# Patient Record
Sex: Male | Born: 1995 | Race: Black or African American | Hispanic: No | Marital: Married | Smoking: Never smoker
Health system: Southern US, Community
[De-identification: ages and names within clinical notes are randomized; demographics above are authoritative.]

---
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
## 2019-05-28 ENCOUNTER — Encounter: Payer: Self-pay | Admitting: Emergency Medicine

## 2019-05-28 ENCOUNTER — Ambulatory Visit
Admission: EM | Admit: 2019-05-28 | Discharge: 2019-05-28 | Disposition: A | Discharge status: Home / Self Care | Payer: Managed Care, Other (non HMO) | Attending: Emergency Medicine | Admitting: Emergency Medicine

## 2019-05-28 ENCOUNTER — Other Ambulatory Visit: Payer: Self-pay

## 2019-05-28 DIAGNOSIS — Z20828 Contact with and (suspected) exposure to other viral communicable diseases: Secondary | ICD-10-CM

## 2019-05-28 DIAGNOSIS — R432 Parageusia: Secondary | ICD-10-CM

## 2019-05-28 DIAGNOSIS — Z20822 Contact with and (suspected) exposure to covid-19: Secondary | ICD-10-CM

## 2019-05-28 NOTE — ED Triage Notes (Signed)
Pt presents to [facility] for assessment of 2 days of emesis and diarrhea.  Exposure to [place] on Christmas.  C/o mild loss of taste.

## 2019-05-28 NOTE — ED Notes (Signed)
Patient able to ambulate independently  

## 2019-05-28 NOTE — ED Provider Notes (Signed)
[facility]    CSN: [id number] Arrival date & time: 05/28/19  3151      History   Chief Complaint Chief Complaint  Patient presents with  . Exposure to COVID    HPI Robert Oliver' D Braid is a 23 y.o. male   Presenting for Covid testing: Exposure: Family member Date of exposure: Christmas Any fever, symptoms since exposure: Yes-2-day course of nonbiliary, nonbloody emesis without nausea and associated diarrhea.  No blood, melena, pain with bowel movements.  Less than 5 BMs daily.  Has decreased sense of taste.  No fever, cough, shortness of breath, abdominal pain.   History reviewed. No pertinent past medical history.  There are no problems to display for this patient.   History reviewed. No pertinent surgical history.     Home Medications    Prior to Admission medications   Not on File    Family History Family History  Problem Relation Age of Onset  . Healthy Mother   . Healthy Father     Social History Social History   Tobacco Use  . Smoking status: Never Smoker  . Smokeless tobacco: Never Used  Substance Use Topics  . Alcohol use: Yes    Comment: socially  . Drug use: Never     Allergies   Patient has no known allergies.   Review of Systems Review of Systems  Constitutional: Negative for fatigue and fever.  Respiratory: Negative for cough and shortness of breath.   Cardiovascular: Negative for chest pain and palpitations.  Gastrointestinal: Positive for diarrhea and vomiting. Negative for abdominal distention, abdominal pain, anal bleeding, blood in stool, constipation, nausea and rectal pain.       Resolved  Musculoskeletal: Negative for arthralgias and myalgias.  Skin: Negative for rash and wound.  Neurological: Negative for speech difficulty and headaches.  All other systems reviewed and are negative.    Physical Exam Triage Vital Signs ED Triage Vitals  Enc Vitals Group     BP      Pulse      Resp      Temp       Temp src      SpO2      Weight      Height      Head Circumference      Peak Flow      Pain Score      Pain Loc      Pain Edu?      Excl. in [place]?    No data found.  Updated Vital Signs BP 115/71 (BP Location: Right Arm)   Pulse 94   Temp 97.8 F (36.6 C) (Temporal)   Resp 16   SpO2 98%   Visual Acuity Right Eye Distance:   Left Eye Distance:   Bilateral Distance:    Right Eye Near:   Left Eye Near:    Bilateral Near:     Physical Exam Constitutional:      General: He is not in acute distress.    Appearance: He is not ill-appearing or toxic-appearing.  HENT:     Head: Normocephalic and atraumatic.     Mouth/Throat:     Mouth: Mucous membranes are moist.     Pharynx: Oropharynx is clear.  Eyes:     General: No scleral icterus.    Pupils: Pupils are equal, round, and reactive to light.  Cardiovascular:     Rate and Rhythm: Normal rate.  Pulmonary:     Effort: Pulmonary effort is  normal. No respiratory distress.     Breath sounds: No wheezing.  Abdominal:     General: Bowel sounds are normal.     Tenderness: There is no abdominal tenderness.  Skin:    Coloration: Skin is not jaundiced or pale.  Neurological:     Mental Status: He is alert and oriented to person, place, and time.      UC Treatments / Results  Labs (all labs ordered are listed, but only abnormal results are displayed) Labs Reviewed  NOVEL CORONAVIRUS, NAA    EKG   Radiology No results found.  Procedures Procedures (including critical care time)  Medications Ordered in UC Medications - No data to display  Initial Impression / Assessment and Plan / UC Course  I have reviewed the triage vital signs and the nursing notes.  Pertinent labs & imaging results that were available during my care of the patient were reviewed by me and considered in my medical decision making (see chart for details).     Patient afebrile, nontoxic, with SpO2 98%.  Covid PCR pending.  Patient to  quarantine until results are back.  We will continue supportive management.  Return precautions discussed, patient verbalized understanding and is agreeable to plan. Final Clinical Impressions(s) / UC Diagnoses   Final diagnoses:  Loss of taste  Exposure to COVID-19 virus     Discharge Instructions     Your COVID test is pending - it is important to quarantine / isolate at home until your results are back. If you test positive and would like further evaluation for persistent or worsening symptoms, you may schedule an E-visit or virtual (video) visit throughout the [facility] app or website.  PLEASE NOTE: If you develop severe chest pain or shortness of breath please go to the ER or call [id number] for further evaluation --> DO NOT schedule electronic or virtual visits for this. Please call our office for further guidance / recommendations as needed.    ED Prescriptions    None     PDMP not reviewed this encounter.   Hall-Potvin, [place], [place] 05/29/19 1246

## 2019-05-28 NOTE — Discharge Instructions (Signed)
Your COVID test is pending - it is important to quarantine / isolate at home until your results are back. °If you test positive and would like further evaluation for persistent or worsening symptoms, you may schedule an E-visit or virtual (video) visit throughout the [facility] MyChart app or website. ° °PLEASE NOTE: If you develop severe chest pain or shortness of breath please go to the ER or call 9-1-1 for further evaluation --> DO NOT schedule electronic or virtual visits for this. °Please call our office for further guidance / recommendations as needed. °

## 2019-05-29 LAB — NOVEL CORONAVIRUS, NAA: SARS-CoV-2, NAA: NOT DETECTED

## 2019-08-15 ENCOUNTER — Encounter: Payer: Self-pay | Admitting: Emergency Medicine

## 2019-08-15 ENCOUNTER — Other Ambulatory Visit: Payer: Self-pay

## 2019-08-15 ENCOUNTER — Ambulatory Visit
Admission: EM | Admit: 2019-08-15 | Discharge: 2019-08-15 | Disposition: A | Discharge status: Home / Self Care | Payer: Managed Care, Other (non HMO) | Attending: Emergency Medicine | Admitting: Emergency Medicine

## 2019-08-15 DIAGNOSIS — Z202 Contact with and (suspected) exposure to infections with a predominantly sexual mode of transmission: Secondary | ICD-10-CM | POA: Diagnosis not present

## 2019-08-15 DIAGNOSIS — R300 Dysuria: Secondary | ICD-10-CM | POA: Insufficient documentation

## 2019-08-15 MED ORDER — METRONIDAZOLE 500 MG PO TABS
2000.0000 mg | ORAL_TABLET | Freq: Once | ORAL | Status: AC
  Administered 2019-08-15: 2000 mg via ORAL

## 2019-08-15 NOTE — ED Triage Notes (Signed)
Pt presents to [facility] for assessment after his wife tested positive for Trich this week.  Denies symptoms at this time.

## 2019-08-15 NOTE — ED Notes (Signed)
Patient able to ambulate independently  

## 2019-08-15 NOTE — Discharge Instructions (Signed)
Today you received treatment for trichomonas. Testing for chlamydia, gonorrhea, trichomonas is pending: please look for these results on the MyChart app/website.  We will notify you if you are positive and outline treatment at that time.  Important to avoid all forms of sexual intercourse (oral, vaginal, anal) with any/all partners for the next 7 days to avoid spreading/reinfecting. Any/all sexual partners should be notified of testing/treatment today.  Return for persistent/worsening symptoms or if you develop fever, abdominal or pelvic pain, blood in your urine, or are re-exposed to an STI. 

## 2019-08-15 NOTE — ED Provider Notes (Signed)
[facility]    CSN: [id number] Arrival date & time: 08/15/19  1320      History   Chief Complaint Chief Complaint  Patient presents with  . Exposure to STI    HPI Robert Oliver is a 24 y.o. male presenting for treatment for trichomoniasis.  States his wife tested positive a few days ago: Underwent treatment 2 days ago.  Has not had sexual intercourse since then.  Patient currently sexually active with 1 male partner, not routinely using condoms.  Patient denies penile or testicular pain, swelling, penile discharge.  Patient does admit to mild burning sensation with urination over the last 2 days: Denies frequency, urgency, hematuria.    History reviewed. No pertinent past medical history.  There are no problems to display for this patient.   History reviewed. No pertinent surgical history.     Home Medications    Prior to Admission medications   Not on File    Family History Family History  Problem Relation Age of Onset  . Healthy Mother   . Healthy Father     Social History Social History   Tobacco Use  . Smoking status: Never Smoker  . Smokeless tobacco: Never Used  Substance Use Topics  . Alcohol use: Yes    Comment: socially  . Drug use: Never     Allergies   Patient has no known allergies.   Review of Systems As per HPI   Physical Exam Triage Vital Signs ED Triage Vitals [08/15/19 1328]  Enc Vitals Group     BP 128/82     Pulse Rate 65     Resp 16     Temp 97.8 F (36.6 C)     Temp Source Temporal     SpO2 98 %     Weight      Height      Head Circumference      Peak Flow      Pain Score 0     Pain Loc      Pain Edu?      Excl. in [place]?    No data found.  Updated Vital Signs BP 128/82 (BP Location: Left Arm)   Pulse 65   Temp 97.8 F (36.6 C) (Temporal)   Resp 16   SpO2 98%   Visual Acuity Right Eye Distance:   Left Eye Distance:   Bilateral Distance:    Right Eye Near:   Left Eye Near:     Bilateral Near:     Physical Exam Constitutional:      General: He is not in acute distress. HENT:     Head: Normocephalic and atraumatic.  Eyes:     General: No scleral icterus.    Pupils: Pupils are equal, round, and reactive to light.  Cardiovascular:     Rate and Rhythm: Normal rate.  Pulmonary:     Effort: Pulmonary effort is normal. No respiratory distress.     Breath sounds: No wheezing.  Genitourinary:    Comments: Patient declined: Self swab performed Skin:    Coloration: Skin is not jaundiced or pale.  Neurological:     Mental Status: He is alert and oriented to person, place, and time.      UC Treatments / Results  Labs (all labs ordered are listed, but only abnormal results are displayed) Labs Reviewed  CYTOLOGY, (ORAL, ANAL, URETHRAL) ANCILLARY ONLY    EKG   Radiology No results found.  Procedures Procedures (including critical care  time)  Medications Ordered in UC Medications  metroNIDAZOLE (FLAGYL) tablet 2,000 mg (has no administration in time range)    Initial Impression / Assessment and Plan / UC Course  I have reviewed the triage vital signs and the nursing notes.  Pertinent labs & imaging results that were available during my care of the patient were reviewed by me and considered in my medical decision making (see chart for details).     Patient febrile, nontoxic.  H&P concerning for acute trichomonas infection: Flagyl given in office which patient tolerated well.  Cytology pending.  Treat for G/C if indicated.  Reviewed safe sex practices as outlined below.  Return precautions discussed, patient verbalized understanding and is agreeable to plan. Final Clinical Impressions(s) / UC Diagnoses   Final diagnoses:  Dysuria  Exposure to trichomonas     Discharge Instructions     Today you received treatment for trichomonas. Testing for chlamydia, gonorrhea, trichomonas is pending: please look for these results on the MyChart app/website.   We will notify you if you are positive and outline treatment at that time.  Important to avoid all forms of sexual intercourse (oral, vaginal, anal) with any/all partners for the next 7 days to avoid spreading/reinfecting. Any/all sexual partners should be notified of testing/treatment today.  Return for persistent/worsening symptoms or if you develop fever, abdominal or pelvic pain, blood in your urine, or are re-exposed to an STI.    ED Prescriptions    None     PDMP not reviewed this encounter.   Hall-Potvin, [place], [place] 08/15/19 1345

## 2019-08-17 LAB — CYTOLOGY, (ORAL, ANAL, URETHRAL) ANCILLARY ONLY
Chlamydia: NEGATIVE
Neisseria Gonorrhea: NEGATIVE
Trichomonas: NEGATIVE

## 2020-02-09 ENCOUNTER — Inpatient Hospital Stay (HOSPITAL_COMMUNITY)
Admission: EM | Admit: 2020-02-09 | Discharge: 2020-02-14 | DRG: 958 | Disposition: A | Discharge status: Home / Self Care | Payer: Medicaid Other | Attending: Physician Assistant | Admitting: Physician Assistant

## 2020-02-09 ENCOUNTER — Emergency Department (HOSPITAL_COMMUNITY): Payer: Medicaid Other

## 2020-02-09 DIAGNOSIS — S8251XA Displaced fracture of medial malleolus of right tibia, initial encounter for closed fracture: Secondary | ICD-10-CM | POA: Diagnosis present

## 2020-02-09 DIAGNOSIS — S52572B Other intraarticular fracture of lower end of left radius, initial encounter for open fracture type I or II: Secondary | ICD-10-CM | POA: Diagnosis not present

## 2020-02-09 DIAGNOSIS — N179 Acute kidney failure, unspecified: Secondary | ICD-10-CM | POA: Diagnosis present

## 2020-02-09 DIAGNOSIS — Y92410 Unspecified street and highway as the place of occurrence of the external cause: Secondary | ICD-10-CM

## 2020-02-09 DIAGNOSIS — S52352B Displaced comminuted fracture of shaft of radius, left arm, initial encounter for open fracture type I or II: Secondary | ICD-10-CM | POA: Diagnosis present

## 2020-02-09 DIAGNOSIS — E860 Dehydration: Secondary | ICD-10-CM | POA: Diagnosis present

## 2020-02-09 DIAGNOSIS — N50811 Right testicular pain: Secondary | ICD-10-CM

## 2020-02-09 DIAGNOSIS — E559 Vitamin D deficiency, unspecified: Secondary | ICD-10-CM | POA: Diagnosis present

## 2020-02-09 DIAGNOSIS — S52612B Displaced fracture of left ulna styloid process, initial encounter for open fracture type I or II: Secondary | ICD-10-CM | POA: Diagnosis present

## 2020-02-09 DIAGNOSIS — S0181XA Laceration without foreign body of other part of head, initial encounter: Secondary | ICD-10-CM | POA: Diagnosis present

## 2020-02-09 DIAGNOSIS — S27321A Contusion of lung, unilateral, initial encounter: Secondary | ICD-10-CM | POA: Diagnosis present

## 2020-02-09 DIAGNOSIS — S62631A Displaced fracture of distal phalanx of left index finger, initial encounter for closed fracture: Secondary | ICD-10-CM | POA: Diagnosis present

## 2020-02-09 DIAGNOSIS — Z23 Encounter for immunization: Secondary | ICD-10-CM

## 2020-02-09 DIAGNOSIS — D62 Acute posthemorrhagic anemia: Secondary | ICD-10-CM | POA: Diagnosis not present

## 2020-02-09 DIAGNOSIS — S3022XA Contusion of scrotum and testes, initial encounter: Secondary | ICD-10-CM | POA: Diagnosis present

## 2020-02-09 DIAGNOSIS — T1490XA Injury, unspecified, initial encounter: Secondary | ICD-10-CM | POA: Diagnosis not present

## 2020-02-09 DIAGNOSIS — S52302B Unspecified fracture of shaft of left radius, initial encounter for open fracture type I or II: Secondary | ICD-10-CM | POA: Diagnosis present

## 2020-02-09 DIAGNOSIS — Z419 Encounter for procedure for purposes other than remedying health state, unspecified: Secondary | ICD-10-CM

## 2020-02-09 DIAGNOSIS — F10129 Alcohol abuse with intoxication, unspecified: Secondary | ICD-10-CM | POA: Diagnosis present

## 2020-02-09 DIAGNOSIS — S62305A Unspecified fracture of fourth metacarpal bone, left hand, initial encounter for closed fracture: Secondary | ICD-10-CM | POA: Diagnosis present

## 2020-02-09 DIAGNOSIS — S0502XA Injury of conjunctiva and corneal abrasion without foreign body, left eye, initial encounter: Secondary | ICD-10-CM | POA: Diagnosis present

## 2020-02-09 DIAGNOSIS — S52502A Unspecified fracture of the lower end of left radius, initial encounter for closed fracture: Secondary | ICD-10-CM | POA: Diagnosis present

## 2020-02-09 DIAGNOSIS — Y908 Blood alcohol level of 240 mg/100 ml or more: Secondary | ICD-10-CM | POA: Diagnosis present

## 2020-02-09 DIAGNOSIS — S022XXA Fracture of nasal bones, initial encounter for closed fracture: Secondary | ICD-10-CM | POA: Diagnosis present

## 2020-02-09 DIAGNOSIS — T148XXA Other injury of unspecified body region, initial encounter: Secondary | ICD-10-CM

## 2020-02-09 DIAGNOSIS — M898X9 Other specified disorders of bone, unspecified site: Secondary | ICD-10-CM | POA: Diagnosis present

## 2020-02-09 DIAGNOSIS — Z20822 Contact with and (suspected) exposure to covid-19: Secondary | ICD-10-CM | POA: Diagnosis present

## 2020-02-09 LAB — CBC
HCT: 49 % (ref 39.0–52.0)
Hemoglobin: 16.1 g/dL (ref 13.0–17.0)
MCH: 30.9 pg (ref 26.0–34.0)
MCHC: 32.9 g/dL (ref 30.0–36.0)
MCV: 94 fL (ref 80.0–100.0)
Platelets: 218 10*3/uL (ref 150–400)
RBC: 5.21 MIL/uL (ref 4.22–5.81)
RDW: 11.6 % (ref 11.5–15.5)
WBC: 7.2 10*3/uL (ref 4.0–10.5)
nRBC: 0 % (ref 0.0–0.2)

## 2020-02-09 LAB — COMPREHENSIVE METABOLIC PANEL
ALT: 36 U/L (ref 0–44)
AST: 63 U/L — ABNORMAL HIGH (ref 15–41)
Albumin: 4.2 g/dL (ref 3.5–5.0)
Alkaline Phosphatase: 37 U/L — ABNORMAL LOW (ref 38–126)
Anion gap: 14 (ref 5–15)
BUN: 10 mg/dL (ref 6–20)
CO2: 20 mmol/L — ABNORMAL LOW (ref 22–32)
Calcium: 9.4 mg/dL (ref 8.9–10.3)
Chloride: 105 mmol/L (ref 98–111)
Creatinine, Ser: 1.31 mg/dL — ABNORMAL HIGH (ref 0.61–1.24)
GFR calc Af Amer: >60 mL/min (ref >60)
GFR calc non Af Amer: >60 mL/min (ref >60)
Glucose, Bld: 132 mg/dL — ABNORMAL HIGH (ref 70–99)
Potassium: 4.8 mmol/L (ref 3.5–5.1)
Sodium: 139 mmol/L (ref 135–145)
Total Bilirubin: 1 mg/dL (ref 0.3–1.2)
Total Protein: 7.3 g/dL (ref 6.5–8.1)

## 2020-02-09 LAB — I-STAT CHEM 8, ED
BUN: 11 mg/dL (ref 6–20)
Calcium, Ion: 0.99 mmol/L — ABNORMAL LOW (ref 1.15–1.40)
Chloride: 106 mmol/L (ref 98–111)
Creatinine, Ser: 1.5 mg/dL — ABNORMAL HIGH (ref 0.61–1.24)
Glucose, Bld: 129 mg/dL — ABNORMAL HIGH (ref 70–99)
HCT: 49 % (ref 39.0–52.0)
Hemoglobin: 16.7 g/dL (ref 13.0–17.0)
Potassium: 3.8 mmol/L (ref 3.5–5.1)
Sodium: 142 mmol/L (ref 135–145)
TCO2: 22 mmol/L (ref 22–32)

## 2020-02-09 LAB — LACTIC ACID, PLASMA: Lactic Acid, Venous: 2.5 mmol/L (ref 0.5–1.9)

## 2020-02-09 LAB — ETHANOL: Alcohol, Ethyl (B): 279 mg/dL — ABNORMAL HIGH

## 2020-02-09 LAB — PROTIME-INR
INR: 1.2 (ref 0.8–1.2)
Prothrombin Time: 14.6 seconds (ref 11.4–15.2)

## 2020-02-09 LAB — SARS CORONAVIRUS 2 BY RT PCR (HOSPITAL ORDER, PERFORMED IN ~~LOC~~ HOSPITAL LAB): SARS Coronavirus 2: NEGATIVE

## 2020-02-09 LAB — SAMPLE TO BLOOD BANK

## 2020-02-09 MED ORDER — TETANUS-DIPHTH-ACELL PERTUSSIS 5-2.5-18.5 LF-MCG/0.5 IM SUSP
0.5000 mL | Freq: Once | INTRAMUSCULAR | Status: AC
  Administered 2020-02-09: 0.5 mL via INTRAMUSCULAR
  Filled 2020-02-09: qty 0.5

## 2020-02-09 MED ORDER — LIDOCAINE-EPINEPHRINE (PF) 2 %-1:200000 IJ SOLN
10.0000 mL | Freq: Once | INTRAMUSCULAR | Status: AC
  Administered 2020-02-10: 10 mL via INTRADERMAL
  Filled 2020-02-09: qty 10

## 2020-02-09 MED ORDER — FENTANYL CITRATE (PF) 100 MCG/2ML IJ SOLN
INTRAMUSCULAR | Status: AC | PRN
  Administered 2020-02-09: 100 ug via INTRAVENOUS

## 2020-02-09 MED ORDER — FENTANYL CITRATE (PF) 100 MCG/2ML IJ SOLN
INTRAMUSCULAR | Status: AC
  Filled 2020-02-09: qty 2

## 2020-02-09 MED ORDER — IOHEXOL 300 MG/ML  SOLN
100.0000 mL | Freq: Once | INTRAMUSCULAR | Status: AC | PRN
  Administered 2020-02-09: 100 mL via INTRAVENOUS

## 2020-02-09 MED ORDER — FENTANYL CITRATE (PF) 100 MCG/2ML IJ SOLN
50.0000 ug | Freq: Once | INTRAMUSCULAR | Status: AC
  Administered 2020-02-09: 50 ug via INTRAVENOUS

## 2020-02-09 MED ORDER — TETRACAINE HCL 0.5 % OP SOLN
2.0000 [drp] | Freq: Once | OPHTHALMIC | Status: AC
  Administered 2020-02-10: 2 [drp] via OPHTHALMIC
  Filled 2020-02-09: qty 4

## 2020-02-09 MED ORDER — CEFAZOLIN SODIUM-DEXTROSE 2-4 GM/100ML-% IV SOLN
2.0000 g | Freq: Once | INTRAVENOUS | Status: AC
  Administered 2020-02-09: 2 g via INTRAVENOUS

## 2020-02-09 MED ORDER — FENTANYL CITRATE (PF) 100 MCG/2ML IJ SOLN
INTRAMUSCULAR | Status: AC
Start: 2020-02-09 — End: 2020-02-10
  Filled 2020-02-09: qty 2

## 2020-02-09 MED ORDER — FLUORESCEIN SODIUM 1 MG OP STRP
1.0000 | ORAL_STRIP | Freq: Once | OPHTHALMIC | Status: AC
  Administered 2020-02-10: 1 via OPHTHALMIC
  Filled 2020-02-09: qty 1

## 2020-02-09 NOTE — H&P (Signed)
I have reviewed images of the L forearm. Tentative plan for iv abx, splint. I&d and orif tomorrow.   Formal consult to follow

## 2020-02-09 NOTE — Progress Notes (Signed)
Orthopedic Tech Progress Note Patient Details:  Robert Oliver [birth year] [id number]  Ortho Devices Type of Ortho Device: Sugartong splint Ortho Device/Splint Location: lue. splint applied with drs assist. Ortho Device/Splint Interventions: Ordered, Application, Adjustment   Post Interventions Patient Tolerated: Well Instructions Provided: Care of device, Adjustment of device   Trinna Post 02/09/2020, 11:15 PM
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
## 2020-02-10 ENCOUNTER — Encounter (HOSPITAL_COMMUNITY): Payer: Self-pay

## 2020-02-10 ENCOUNTER — Encounter (HOSPITAL_COMMUNITY): Admission: EM | Disposition: A | Discharge status: Home / Self Care | Payer: Self-pay | Source: Home / Self Care

## 2020-02-10 ENCOUNTER — Inpatient Hospital Stay (HOSPITAL_COMMUNITY): Payer: Medicaid Other

## 2020-02-10 ENCOUNTER — Inpatient Hospital Stay (HOSPITAL_COMMUNITY): Payer: Medicaid Other | Admitting: Certified Registered"

## 2020-02-10 DIAGNOSIS — S27321A Contusion of lung, unilateral, initial encounter: Secondary | ICD-10-CM | POA: Diagnosis present

## 2020-02-10 DIAGNOSIS — S52352B Displaced comminuted fracture of shaft of radius, left arm, initial encounter for open fracture type I or II: Secondary | ICD-10-CM | POA: Diagnosis present

## 2020-02-10 DIAGNOSIS — S62631A Displaced fracture of distal phalanx of left index finger, initial encounter for closed fracture: Secondary | ICD-10-CM | POA: Diagnosis present

## 2020-02-10 DIAGNOSIS — E559 Vitamin D deficiency, unspecified: Secondary | ICD-10-CM | POA: Diagnosis present

## 2020-02-10 DIAGNOSIS — S52572B Other intraarticular fracture of lower end of left radius, initial encounter for open fracture type I or II: Secondary | ICD-10-CM | POA: Diagnosis present

## 2020-02-10 DIAGNOSIS — F10129 Alcohol abuse with intoxication, unspecified: Secondary | ICD-10-CM | POA: Diagnosis present

## 2020-02-10 DIAGNOSIS — E860 Dehydration: Secondary | ICD-10-CM | POA: Diagnosis present

## 2020-02-10 DIAGNOSIS — Z23 Encounter for immunization: Secondary | ICD-10-CM | POA: Diagnosis not present

## 2020-02-10 DIAGNOSIS — D62 Acute posthemorrhagic anemia: Secondary | ICD-10-CM | POA: Diagnosis not present

## 2020-02-10 DIAGNOSIS — S022XXA Fracture of nasal bones, initial encounter for closed fracture: Secondary | ICD-10-CM | POA: Diagnosis present

## 2020-02-10 DIAGNOSIS — Y908 Blood alcohol level of 240 mg/100 ml or more: Secondary | ICD-10-CM | POA: Diagnosis present

## 2020-02-10 DIAGNOSIS — N179 Acute kidney failure, unspecified: Secondary | ICD-10-CM | POA: Diagnosis present

## 2020-02-10 DIAGNOSIS — M898X9 Other specified disorders of bone, unspecified site: Secondary | ICD-10-CM | POA: Diagnosis present

## 2020-02-10 DIAGNOSIS — S3022XA Contusion of scrotum and testes, initial encounter: Secondary | ICD-10-CM | POA: Diagnosis present

## 2020-02-10 DIAGNOSIS — T1490XA Injury, unspecified, initial encounter: Secondary | ICD-10-CM | POA: Diagnosis present

## 2020-02-10 DIAGNOSIS — S52612B Displaced fracture of left ulna styloid process, initial encounter for open fracture type I or II: Secondary | ICD-10-CM | POA: Diagnosis present

## 2020-02-10 DIAGNOSIS — S8251XA Displaced fracture of medial malleolus of right tibia, initial encounter for closed fracture: Secondary | ICD-10-CM | POA: Diagnosis present

## 2020-02-10 DIAGNOSIS — S5290XA Unspecified fracture of unspecified forearm, initial encounter for closed fracture: Secondary | ICD-10-CM

## 2020-02-10 DIAGNOSIS — S52502A Unspecified fracture of the lower end of left radius, initial encounter for closed fracture: Secondary | ICD-10-CM | POA: Diagnosis present

## 2020-02-10 DIAGNOSIS — S62305A Unspecified fracture of fourth metacarpal bone, left hand, initial encounter for closed fracture: Secondary | ICD-10-CM | POA: Diagnosis present

## 2020-02-10 DIAGNOSIS — S0502XA Injury of conjunctiva and corneal abrasion without foreign body, left eye, initial encounter: Secondary | ICD-10-CM | POA: Diagnosis present

## 2020-02-10 DIAGNOSIS — Y92410 Unspecified street and highway as the place of occurrence of the external cause: Secondary | ICD-10-CM | POA: Diagnosis not present

## 2020-02-10 DIAGNOSIS — S0181XA Laceration without foreign body of other part of head, initial encounter: Secondary | ICD-10-CM | POA: Diagnosis present

## 2020-02-10 DIAGNOSIS — Z20822 Contact with and (suspected) exposure to covid-19: Secondary | ICD-10-CM | POA: Diagnosis present

## 2020-02-10 HISTORY — PX: I & D EXTREMITY: SHX5045

## 2020-02-10 HISTORY — PX: PERCUTANEOUS PINNING: SHX2209

## 2020-02-10 HISTORY — PX: OPEN REDUCTION INTERNAL FIXATION (ORIF) DISTAL RADIAL FRACTURE: SHX5989

## 2020-02-10 HISTORY — PX: ORIF DISTAL RADIUS FRACTURE: SUR927

## 2020-02-10 LAB — CBC
HCT: 39.8 % (ref 39.0–52.0)
Hemoglobin: 13.3 g/dL (ref 13.0–17.0)
MCH: 30.3 pg (ref 26.0–34.0)
MCHC: 33.4 g/dL (ref 30.0–36.0)
MCV: 90.7 fL (ref 80.0–100.0)
Platelets: 176 10*3/uL (ref 150–400)
RBC: 4.39 MIL/uL (ref 4.22–5.81)
RDW: 11.4 % — ABNORMAL LOW (ref 11.5–15.5)
WBC: 10.5 10*3/uL (ref 4.0–10.5)
nRBC: 0 % (ref 0.0–0.2)

## 2020-02-10 LAB — URINALYSIS, ROUTINE W REFLEX MICROSCOPIC
Bacteria, UA: NONE SEEN
Bilirubin Urine: NEGATIVE
Glucose, UA: NEGATIVE mg/dL
Ketones, ur: NEGATIVE mg/dL
Leukocytes,Ua: NEGATIVE
Nitrite: NEGATIVE
Protein, ur: 30 mg/dL — AB
Specific Gravity, Urine: 1.016 (ref 1.005–1.030)
pH: 5 (ref 5.0–8.0)

## 2020-02-10 LAB — CREATININE, SERUM
Creatinine, Ser: 1.06 mg/dL (ref 0.61–1.24)
GFR calc Af Amer: >60 mL/min (ref >60)
GFR calc non Af Amer: >60 mL/min (ref >60)

## 2020-02-10 LAB — MAGNESIUM: Magnesium: 1.7 mg/dL (ref 1.7–2.4)

## 2020-02-10 LAB — HIV ANTIBODY (ROUTINE TESTING W REFLEX): HIV Screen 4th Generation wRfx: NONREACTIVE

## 2020-02-10 LAB — PHOSPHORUS: Phosphorus: 3.6 mg/dL (ref 2.5–4.6)

## 2020-02-10 SURGERY — OPEN REDUCTION INTERNAL FIXATION (ORIF) DISTAL RADIUS FRACTURE
Anesthesia: Regional | Laterality: Left

## 2020-02-10 MED ORDER — FENTANYL CITRATE (PF) 100 MCG/2ML IJ SOLN
INTRAMUSCULAR | Status: AC
  Filled 2020-02-10: qty 2

## 2020-02-10 MED ORDER — MIDAZOLAM HCL 2 MG/2ML IJ SOLN
INTRAMUSCULAR | Status: AC
  Filled 2020-02-10: qty 2

## 2020-02-10 MED ORDER — LORAZEPAM 1 MG PO TABS: 1.0000 mg | ORAL_TABLET | ORAL | Status: DC | PRN

## 2020-02-10 MED ORDER — ENOXAPARIN SODIUM 30 MG/0.3ML ~~LOC~~ SOLN
30.0000 mg | Freq: Two times a day (BID) | SUBCUTANEOUS | Status: DC
  Administered 2020-02-10 – 2020-02-14 (×6): 30 mg via SUBCUTANEOUS
  Filled 2020-02-10 (×6): qty 0.3

## 2020-02-10 MED ORDER — DEXAMETHASONE SODIUM PHOSPHATE 10 MG/ML IJ SOLN
INTRAMUSCULAR | Status: DC | PRN
  Administered 2020-02-10: 5 mg via INTRAVENOUS

## 2020-02-10 MED ORDER — SODIUM CHLORIDE 0.9 % IV SOLN
2.0000 g | INTRAVENOUS | Status: AC
  Administered 2020-02-10 – 2020-02-12 (×3): 2 g via INTRAVENOUS
  Filled 2020-02-10: qty 2
  Filled 2020-02-10: qty 20
  Filled 2020-02-10: qty 2
  Filled 2020-02-10: qty 20

## 2020-02-10 MED ORDER — PROPOFOL 10 MG/ML IV BOLUS
INTRAVENOUS | Status: AC
  Filled 2020-02-10: qty 20

## 2020-02-10 MED ORDER — LACTATED RINGERS IV SOLN: INTRAVENOUS | Status: DC

## 2020-02-10 MED ORDER — OXYCODONE HCL 5 MG PO TABS: 5.0000 mg | ORAL_TABLET | Freq: Once | ORAL | Status: DC | PRN

## 2020-02-10 MED ORDER — THIAMINE HCL 100 MG/ML IJ SOLN
100.0000 mg | Freq: Every day | INTRAMUSCULAR | Status: DC
  Administered 2020-02-11: 100 mg via INTRAVENOUS
  Filled 2020-02-10: qty 2

## 2020-02-10 MED ORDER — AMISULPRIDE (ANTIEMETIC) 5 MG/2ML IV SOLN: 10.0000 mg | Freq: Once | INTRAVENOUS | Status: DC | PRN

## 2020-02-10 MED ORDER — CHLORHEXIDINE GLUCONATE 4 % EX LIQD: 60.0000 mL | Freq: Once | CUTANEOUS | Status: DC

## 2020-02-10 MED ORDER — HYDROMORPHONE HCL 1 MG/ML IJ SOLN
INTRAMUSCULAR | Status: AC
  Filled 2020-02-10: qty 0.5

## 2020-02-10 MED ORDER — POVIDONE-IODINE 10 % EX SWAB
2.0000 "application " | Freq: Once | CUTANEOUS | Status: AC
  Administered 2020-02-10: 2 via TOPICAL

## 2020-02-10 MED ORDER — ORAL CARE MOUTH RINSE: 15.0000 mL | Freq: Once | OROMUCOSAL | Status: AC

## 2020-02-10 MED ORDER — SODIUM CHLORIDE 0.9 % IV SOLN: INTRAVENOUS | Status: DC

## 2020-02-10 MED ORDER — ERYTHROMYCIN 5 MG/GM OP OINT
1.0000 "application " | TOPICAL_OINTMENT | Freq: Four times a day (QID) | OPHTHALMIC | Status: DC
  Administered 2020-02-10 – 2020-02-14 (×13): 1 via OPHTHALMIC
  Filled 2020-02-10 (×3): qty 3.5

## 2020-02-10 MED ORDER — ONDANSETRON HCL 4 MG/2ML IJ SOLN
4.0000 mg | Freq: Four times a day (QID) | INTRAMUSCULAR | Status: DC | PRN
  Administered 2020-02-13: 4 mg via INTRAVENOUS

## 2020-02-10 MED ORDER — HYDROMORPHONE HCL 1 MG/ML IJ SOLN
INTRAMUSCULAR | Status: DC | PRN
  Administered 2020-02-10: .5 mg via INTRAVENOUS

## 2020-02-10 MED ORDER — HYDROMORPHONE HCL 1 MG/ML IJ SOLN: 0.2500 mg | INTRAMUSCULAR | Status: DC | PRN

## 2020-02-10 MED ORDER — ACETAMINOPHEN 10 MG/ML IV SOLN: 1000.0000 mg | Freq: Once | INTRAVENOUS | Status: DC | PRN

## 2020-02-10 MED ORDER — CEFAZOLIN SODIUM-DEXTROSE 2-4 GM/100ML-% IV SOLN
2.0000 g | INTRAVENOUS | Status: AC
  Administered 2020-02-10 (×2): 2 g via INTRAVENOUS
  Filled 2020-02-10: qty 100

## 2020-02-10 MED ORDER — OXYCODONE HCL 5 MG/5ML PO SOLN: 5.0000 mg | Freq: Once | ORAL | Status: DC | PRN

## 2020-02-10 MED ORDER — CHLORHEXIDINE GLUCONATE 0.12 % MT SOLN
15.0000 mL | Freq: Once | OROMUCOSAL | Status: DC
  Filled 2020-02-10: qty 15

## 2020-02-10 MED ORDER — ACETAMINOPHEN 10 MG/ML IV SOLN
INTRAVENOUS | Status: AC
  Filled 2020-02-10: qty 100

## 2020-02-10 MED ORDER — ONDANSETRON 4 MG PO TBDP: 4.0000 mg | ORAL_TABLET | Freq: Four times a day (QID) | ORAL | Status: DC | PRN

## 2020-02-10 MED ORDER — SODIUM CHLORIDE 0.9 % IR SOLN
Status: DC | PRN
  Administered 2020-02-10: 3000 mL

## 2020-02-10 MED ORDER — ONDANSETRON HCL 4 MG/2ML IJ SOLN
INTRAMUSCULAR | Status: DC | PRN
  Administered 2020-02-10: 4 mg via INTRAVENOUS

## 2020-02-10 MED ORDER — CHLORHEXIDINE GLUCONATE 0.12 % MT SOLN: 15.0000 mL | Freq: Once | OROMUCOSAL | Status: AC

## 2020-02-10 MED ORDER — PROPOFOL 500 MG/50ML IV EMUL
INTRAVENOUS | Status: DC | PRN
  Administered 2020-02-10: 100 ug/kg/min via INTRAVENOUS

## 2020-02-10 MED ORDER — FENTANYL CITRATE (PF) 250 MCG/5ML IJ SOLN
INTRAMUSCULAR | Status: AC
  Filled 2020-02-10: qty 5

## 2020-02-10 MED ORDER — MEPERIDINE HCL 25 MG/ML IJ SOLN: 6.2500 mg | INTRAMUSCULAR | Status: DC | PRN

## 2020-02-10 MED ORDER — LIDOCAINE HCL (CARDIAC) PF 100 MG/5ML IV SOSY
PREFILLED_SYRINGE | INTRAVENOUS | Status: DC | PRN
  Administered 2020-02-10: 40 mg via INTRAVENOUS

## 2020-02-10 MED ORDER — ACETAMINOPHEN 10 MG/ML IV SOLN
INTRAVENOUS | Status: DC | PRN
  Administered 2020-02-10: 1000 mg via INTRAVENOUS

## 2020-02-10 MED ORDER — DOCUSATE SODIUM 100 MG PO CAPS
100.0000 mg | ORAL_CAPSULE | Freq: Two times a day (BID) | ORAL | Status: DC
  Administered 2020-02-10 – 2020-02-14 (×8): 100 mg via ORAL
  Filled 2020-02-10 (×9): qty 1

## 2020-02-10 MED ORDER — ADULT MULTIVITAMIN W/MINERALS CH
1.0000 | ORAL_TABLET | Freq: Every day | ORAL | Status: DC
  Administered 2020-02-11 – 2020-02-14 (×4): 1 via ORAL
  Filled 2020-02-10 (×4): qty 1

## 2020-02-10 MED ORDER — OXYCODONE HCL 5 MG PO TABS
5.0000 mg | ORAL_TABLET | ORAL | Status: DC | PRN
  Administered 2020-02-10: 5 mg via ORAL
  Administered 2020-02-11 – 2020-02-14 (×10): 10 mg via ORAL
  Filled 2020-02-10 (×7): qty 2
  Filled 2020-02-10: qty 1
  Filled 2020-02-10 (×3): qty 2

## 2020-02-10 MED ORDER — BUPIVACAINE-EPINEPHRINE (PF) 0.5% -1:200000 IJ SOLN
INTRAMUSCULAR | Status: DC | PRN
  Administered 2020-02-10: 30 mL via PERINEURAL

## 2020-02-10 MED ORDER — LORAZEPAM 2 MG/ML IJ SOLN: 1.0000 mg | INTRAMUSCULAR | Status: DC | PRN

## 2020-02-10 MED ORDER — 0.9 % SODIUM CHLORIDE (POUR BTL) OPTIME
TOPICAL | Status: DC | PRN
  Administered 2020-02-10: 1000 mL

## 2020-02-10 MED ORDER — ROCURONIUM BROMIDE 10 MG/ML (PF) SYRINGE
PREFILLED_SYRINGE | INTRAVENOUS | Status: AC
  Filled 2020-02-10: qty 10

## 2020-02-10 MED ORDER — FOLIC ACID 1 MG PO TABS
1.0000 mg | ORAL_TABLET | Freq: Every day | ORAL | Status: DC
  Administered 2020-02-11 – 2020-02-14 (×4): 1 mg via ORAL
  Filled 2020-02-10 (×4): qty 1

## 2020-02-10 MED ORDER — HYDROMORPHONE HCL 1 MG/ML IJ SOLN
1.0000 mg | Freq: Once | INTRAMUSCULAR | Status: AC
  Administered 2020-02-10: 1 mg via INTRAVENOUS
  Filled 2020-02-10: qty 1

## 2020-02-10 MED ORDER — PROPOFOL 10 MG/ML IV BOLUS
INTRAVENOUS | Status: DC | PRN
  Administered 2020-02-10 (×2): 30 mg via INTRAVENOUS

## 2020-02-10 MED ORDER — ACETAMINOPHEN 325 MG PO TABS: 650.0000 mg | ORAL_TABLET | ORAL | Status: DC | PRN

## 2020-02-10 MED ORDER — CEFAZOLIN SODIUM-DEXTROSE 2-4 GM/100ML-% IV SOLN
2.0000 g | Freq: Three times a day (TID) | INTRAVENOUS | Status: DC
  Administered 2020-02-10 – 2020-02-11 (×3): 2 g via INTRAVENOUS
  Filled 2020-02-10 (×3): qty 100

## 2020-02-10 MED ORDER — PROPOFOL 1000 MG/100ML IV EMUL
INTRAVENOUS | Status: AC
  Filled 2020-02-10: qty 300

## 2020-02-10 MED ORDER — MORPHINE SULFATE (PF) 2 MG/ML IV SOLN
2.0000 mg | INTRAVENOUS | Status: DC | PRN
  Administered 2020-02-11: 2 mg via INTRAVENOUS
  Filled 2020-02-10: qty 1

## 2020-02-10 MED ORDER — THIAMINE HCL 100 MG PO TABS
100.0000 mg | ORAL_TABLET | Freq: Every day | ORAL | Status: DC
  Administered 2020-02-12 – 2020-02-14 (×3): 100 mg via ORAL
  Filled 2020-02-10 (×3): qty 1

## 2020-02-10 MED ORDER — OXYCODONE HCL 5 MG PO TABS: 5.0000 mg | ORAL_TABLET | ORAL | Status: DC | PRN

## 2020-02-10 MED ORDER — LIDOCAINE 2% (20 MG/ML) 5 ML SYRINGE
INTRAMUSCULAR | Status: AC
  Filled 2020-02-10: qty 5

## 2020-02-10 MED ORDER — ACETAMINOPHEN 325 MG PO TABS: 325.0000 mg | ORAL_TABLET | Freq: Once | ORAL | Status: DC | PRN

## 2020-02-10 MED ORDER — ACETAMINOPHEN 160 MG/5ML PO SOLN: 325.0000 mg | Freq: Once | ORAL | Status: DC | PRN

## 2020-02-10 MED ORDER — CHLORHEXIDINE GLUCONATE 0.12 % MT SOLN
OROMUCOSAL | Status: AC
  Administered 2020-02-10: 15 mL via OROMUCOSAL
  Filled 2020-02-10: qty 15

## 2020-02-10 SURGICAL SUPPLY — 107 items
BIT DRILL 2.2 SS TIBIAL (BIT) ×2 IMPLANT
BIT DRILL 2.8X5 QR DISP (BIT) ×2 IMPLANT
BLADE 10 SAFETY STRL DISP (BLADE) ×2 IMPLANT
BLADE CLIPPER SURG (BLADE) IMPLANT
BNDG COHESIVE 4X5 TAN STRL (GAUZE/BANDAGES/DRESSINGS) ×2 IMPLANT
BNDG CONFORM 2 STRL LF (GAUZE/BANDAGES/DRESSINGS) ×2 IMPLANT
BNDG ELASTIC 3X5.8 VLCR STR LF (GAUZE/BANDAGES/DRESSINGS) ×2 IMPLANT
BNDG ELASTIC 4X5.8 VLCR NS LF (GAUZE/BANDAGES/DRESSINGS) ×2 IMPLANT
BNDG ELASTIC 4X5.8 VLCR STR LF (GAUZE/BANDAGES/DRESSINGS) ×2 IMPLANT
BNDG ELASTIC 6X5.8 VLCR STR LF (GAUZE/BANDAGES/DRESSINGS) ×2 IMPLANT
BNDG ESMARK 4X9 LF (GAUZE/BANDAGES/DRESSINGS) ×2 IMPLANT
BNDG GAUZE ELAST 4 BULKY (GAUZE/BANDAGES/DRESSINGS) ×2 IMPLANT
BRUSH SCRUB EZ  4% CHG (MISCELLANEOUS) ×1
BRUSH SCRUB EZ 4% CHG (MISCELLANEOUS) ×1 IMPLANT
BRUSH SCRUB EZ PLAIN DRY (MISCELLANEOUS) ×2 IMPLANT
CLEANER TIP ELECTROSURG 2X2 (MISCELLANEOUS) ×2 IMPLANT
CORD BIPOLAR FORCEPS 12FT (ELECTRODE) ×2 IMPLANT
COVER SURGICAL LIGHT HANDLE (MISCELLANEOUS) ×4 IMPLANT
DRAPE C-ARM 42X72 X-RAY (DRAPES) ×2 IMPLANT
DRAPE C-ARMOR (DRAPES) ×2 IMPLANT
DRAPE U-SHAPE 47X51 STRL (DRAPES) ×2 IMPLANT
DRIVER BIT SQUARE 1.7/2.2 (TRAUMA) ×4 IMPLANT
DRSG ADAPTIC 3X8 NADH LF (GAUZE/BANDAGES/DRESSINGS) IMPLANT
DRSG EMULSION OIL 3X3 NADH (GAUZE/BANDAGES/DRESSINGS) IMPLANT
DRSG MEPITEL 4X7.2 (GAUZE/BANDAGES/DRESSINGS) ×2 IMPLANT
DRSG PAD ABDOMINAL 8X10 ST (GAUZE/BANDAGES/DRESSINGS) ×2 IMPLANT
ELECT REM PT RETURN 9FT ADLT (ELECTROSURGICAL) ×2
ELECTRODE REM PT RTRN 9FT ADLT (ELECTROSURGICAL) ×1 IMPLANT
GAUZE SPONGE 4X4 12PLY STRL (GAUZE/BANDAGES/DRESSINGS) ×2 IMPLANT
GLOVE BIO SURGEON STRL SZ7.5 (GLOVE) ×6 IMPLANT
GLOVE BIO SURGEON STRL SZ8 (GLOVE) ×6 IMPLANT
GLOVE BIOGEL PI IND STRL 7.5 (GLOVE) ×1 IMPLANT
GLOVE BIOGEL PI IND STRL 8 (GLOVE) ×1 IMPLANT
GLOVE BIOGEL PI INDICATOR 7.5 (GLOVE) ×1
GLOVE BIOGEL PI INDICATOR 8 (GLOVE) ×1
GOWN STRL REUS W/ TWL LRG LVL3 (GOWN DISPOSABLE) ×2 IMPLANT
GOWN STRL REUS W/ TWL XL LVL3 (GOWN DISPOSABLE) ×2 IMPLANT
GOWN STRL REUS W/TWL LRG LVL3 (GOWN DISPOSABLE) ×2
GOWN STRL REUS W/TWL XL LVL3 (GOWN DISPOSABLE) ×2
HANDPIECE INTERPULSE COAX TIP (DISPOSABLE) ×1
K-WIRE 1.6 (WIRE) ×4
K-WIRE FX5X1.6XNS BN SS (WIRE) ×4
KIT BASIN OR (CUSTOM PROCEDURE TRAY) ×2 IMPLANT
KIT TURNOVER KIT B (KITS) ×2 IMPLANT
KWIRE FX5X1.6XNS BN SS (WIRE) ×4 IMPLANT
MANIFOLD NEPTUNE II (INSTRUMENTS) ×2 IMPLANT
NEEDLE HYPO 25GX1X1/2 BEV (NEEDLE) ×2 IMPLANT
NS IRRIG 1000ML POUR BTL (IV SOLUTION) ×2 IMPLANT
PACK ORTHO EXTREMITY (CUSTOM PROCEDURE TRAY) ×2 IMPLANT
PAD ARMBOARD 7.5X6 YLW CONV (MISCELLANEOUS) ×4 IMPLANT
PAD CAST 3X4 CTTN HI CHSV (CAST SUPPLIES) ×1 IMPLANT
PAD CAST 4YDX4 CTTN HI CHSV (CAST SUPPLIES) ×1 IMPLANT
PADDING CAST ABS 4INX4YD NS (CAST SUPPLIES) ×1
PADDING CAST ABS 6INX4YD NS (CAST SUPPLIES) ×1
PADDING CAST ABS COTTON 4X4 ST (CAST SUPPLIES) ×1 IMPLANT
PADDING CAST ABS COTTON 6X4 NS (CAST SUPPLIES) ×1 IMPLANT
PADDING CAST COTTON 3X4 STRL (CAST SUPPLIES) ×1
PADDING CAST COTTON 4X4 STRL (CAST SUPPLIES) ×1
PADDING CAST COTTON 6X4 STRL (CAST SUPPLIES) ×2 IMPLANT
PEG LOCKING SMOOTH 2.2X16 (Screw) ×2 IMPLANT
PEG LOCKING SMOOTH 2.2X20 (Screw) ×4 IMPLANT
PEG LOCKING SMOOTH 2.2X24 (Peg) ×6 IMPLANT
PEG LOCKING SMOOTH 2.2X26 (Peg) ×2 IMPLANT
PLATE 8 HOLE RADIUS (Plate) ×2 IMPLANT
PLATE STANDARD DVR LEFT (Plate) ×2 IMPLANT
PLATE STD DVR LT 24X51 (Plate) ×1 IMPLANT
SCREW  LP NL 2.7X16MM (Screw) ×1 IMPLANT
SCREW 2.7X14MM (Screw) ×1 IMPLANT
SCREW BN 14X2.7XNONLOCK 3 LD (Screw) ×1 IMPLANT
SCREW HEXALOBE NON-LOCK 3.5X14 (Screw) ×8 IMPLANT
SCREW LOCK 16X2.7X 3 LD TPR (Screw) ×1 IMPLANT
SCREW LOCKING 2.7X15MM (Screw) ×4 IMPLANT
SCREW LOCKING 2.7X16 (Screw) ×1 IMPLANT
SCREW LP NL 2.7X16MM (Screw) ×1 IMPLANT
SCREW MULTI DIRECTIONAL 2.7X20 (Screw) ×2 IMPLANT
SCREW NONLOCK HEX 3.5X12 (Screw) ×4 IMPLANT
SET HNDPC FAN SPRY TIP SCT (DISPOSABLE) ×1 IMPLANT
SLING ARM FOAM STRAP XLG (SOFTGOODS) ×2 IMPLANT
SOAP 2 % CHG 4 OZ (WOUND CARE) ×2 IMPLANT
SOL PREP POV-IOD 4OZ 10% (MISCELLANEOUS) ×2 IMPLANT
SOL PREP PROV IODINE SCRUB 4OZ (MISCELLANEOUS) ×2 IMPLANT
SPLINT PLASTER CAST XFAST 4X15 (CAST SUPPLIES) ×1 IMPLANT
SPLINT PLASTER CAST XFAST 5X30 (CAST SUPPLIES) ×1 IMPLANT
SPLINT PLASTER XFAST SET 5X30 (CAST SUPPLIES) ×1
SPLINT PLASTER XTRA FAST SET 4 (CAST SUPPLIES) ×1
SPONGE LAP 18X18 RF (DISPOSABLE) ×2 IMPLANT
SPONGE LAP 18X18 X RAY DECT (DISPOSABLE) ×6 IMPLANT
STOCKINETTE IMPERVIOUS 9X36 MD (GAUZE/BANDAGES/DRESSINGS) ×2 IMPLANT
SUCTION FRAZIER TIP 8 FR DISP (SUCTIONS) ×2
SUCTION TUBE FRAZIER 8FR DISP (SUCTIONS) ×2 IMPLANT
SUT ETHILON 2 0 PSLX (SUTURE) ×8 IMPLANT
SUT ETHILON 3 0 PS 1 (SUTURE) ×2 IMPLANT
SUT PDS AB 2-0 CT1 27 (SUTURE) ×4 IMPLANT
SUT PROLENE 6 0 P 3 18 (SUTURE) ×2 IMPLANT
SUT VIC AB 0 CT1 27 (SUTURE) ×1
SUT VIC AB 0 CT1 27XBRD ANBCTR (SUTURE) ×1 IMPLANT
SUT VIC AB 0 CT3 27 (SUTURE) IMPLANT
SUT VIC AB 2-0 CT1 27 (SUTURE) ×2
SUT VIC AB 2-0 CT1 TAPERPNT 27 (SUTURE) ×2 IMPLANT
SYR BULB IRRIG 60ML STRL (SYRINGE) ×2 IMPLANT
SYR CONTROL 10ML LL (SYRINGE) IMPLANT
TOWEL GREEN STERILE (TOWEL DISPOSABLE) ×4 IMPLANT
TOWEL GREEN STERILE FF (TOWEL DISPOSABLE) ×2 IMPLANT
TUBE CONNECTING 12X1/4 (SUCTIONS) ×2 IMPLANT
UNDERPAD 30X36 HEAVY ABSORB (UNDERPADS AND DIAPERS) ×2 IMPLANT
WATER STERILE IRR 1000ML POUR (IV SOLUTION) ×2 IMPLANT
YANKAUER SUCT BULB TIP NO VENT (SUCTIONS) ×2 IMPLANT

## 2020-02-10 NOTE — Progress Notes (Signed)
°   02/09/20 2230  Clinical Encounter Type  Visited With Family  Visit Type Trauma  Referral From Nurse  Consult/Referral To Chaplain  Spiritual Encounters  Spiritual Needs Emotional  Stress Factors  Patient Stress Factors Health changes  Family Stress Factors Major life changes  Chaplain responded to page. Patient being seen in [facility]. Waited for patient's spouse and she and patient's mother were taken to Consult A. Chaplain waited with family until spouse able to see patient. Chaplain available when needed.
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
## 2020-02-10 NOTE — Anesthesia Preprocedure Evaluation (Addendum)
Anesthesia Evaluation  Patient identified by MRN, date of birth, ID band Patient awake    Reviewed: Allergy & Precautions, NPO status , Patient's Chart, lab work & pertinent test results, Unable to perform ROS - Chart review only  Airway Mallampati: I  TM Distance: >3 FB Neck ROM: Full    Dental  (+) Dental Advisory Given, Teeth Intact   Pulmonary neg pulmonary ROS,    breath sounds clear to auscultation- rhonchi       Cardiovascular negative cardio ROS   Rhythm:Regular Rate:Normal     Neuro/Psych negative neurological ROS     GI/Hepatic negative GI ROS, Neg liver ROS,   Endo/Other  negative endocrine ROS  Renal/GU negative Renal ROS     Musculoskeletal negative musculoskeletal ROS (+)   Abdominal Normal abdominal exam  (+)   Peds  Hematology negative hematology ROS (+)   Anesthesia Other Findings   Reproductive/Obstetrics                            Anesthesia Physical Anesthesia Plan  ASA: II  Anesthesia Plan: Regional   Post-op Pain Management:    Induction: Intravenous  PONV Risk Score and Plan: 2 and Ondansetron, Propofol infusion and Midazolam  Airway Management Planned: Natural Airway and Simple Face Mask  Additional Equipment: None  Intra-op Plan:   Post-operative Plan:   Informed Consent: I have reviewed the patients History and Physical, chart, labs and discussed the procedure including the risks, benefits and alternatives for the proposed anesthesia with the patient or authorized representative who has indicated his/her understanding and acceptance.     Dental advisory given  Plan Discussed with: CRNA  Anesthesia Plan Comments:        Anesthesia Quick Evaluation

## 2020-02-10 NOTE — ED Notes (Signed)
Urine culture sent with u/a °

## 2020-02-10 NOTE — Transfer of Care (Signed)
Immediate Anesthesia Transfer of Care Note  Patient: Robert Oliver  Procedure(s) Performed: OPEN REDUCTION INTERNAL FIXATION (ORIF) DISTAL RADIAL FRACTURE (Left ) PERCUTANEOUS PINNING 4th METACARPAL (Left ) IRRIGATION AND DEBRIDEMENT FOREARM (Left )  Patient Location: PACU  Anesthesia Type:MAC and Regional  Level of Consciousness: awake, alert , oriented and patient cooperative  Airway & Oxygen Therapy: Patient Spontanous Breathing  Post-op Assessment: Report given to RN and Post -op Vital signs reviewed and stable  Post vital signs: Reviewed and stable  Last Vitals:  Vitals Value Taken Time  BP 132/88 02/10/20 1834  Temp    Pulse 90 02/10/20 1835  Resp 18 02/10/20 1835  SpO2 100 % 02/10/20 1835  Vitals shown include unvalidated device data.  Last Pain:  Vitals:   02/10/20 1232  TempSrc:   PainSc: 10-Worst pain ever      Patients Stated Pain Goal: 2 (78/47/84 1282)  Complications: No complications documented.
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
## 2020-02-10 NOTE — ED Notes (Signed)
COnsent for OR signed by patient

## 2020-02-10 NOTE — Anesthesia Postprocedure Evaluation (Signed)
Anesthesia Post Note  Patient: Robert Oliver  Procedure(s) Performed: OPEN REDUCTION INTERNAL FIXATION (ORIF) DISTAL RADIAL FRACTURE (Left ) PERCUTANEOUS PINNING 4th METACARPAL (Left ) IRRIGATION AND DEBRIDEMENT FOREARM (Left )     Patient location during evaluation: PACU Anesthesia Type: Regional Level of consciousness: sedated, patient cooperative and oriented Pain management: pain level controlled Vital Signs Assessment: post-procedure vital signs reviewed and stable Respiratory status: spontaneous breathing, nonlabored ventilation and respiratory function stable Cardiovascular status: blood pressure returned to baseline and stable Postop Assessment: no apparent nausea or vomiting Anesthetic complications: no   No complications documented.  Last Vitals:  Vitals:   02/10/20 1850 02/10/20 1905  BP: 125/80 120/75  Pulse: 87 75  Resp: 18 13  Temp:    SpO2: 98% 99%    Last Pain:  Vitals:   02/10/20 1905  TempSrc:   PainSc: Asleep                 [place] Katich,E. Hendrik Donath
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
## 2020-02-10 NOTE — Anesthesia Procedure Notes (Signed)
Anesthesia Regional Block: Supraclavicular block   Pre-Anesthetic Checklist: ,, timeout performed, Correct Patient, Correct Site, Correct Laterality, Correct Procedure, Correct Position, site marked, Risks and benefits discussed,  Surgical consent,  Pre-op evaluation,  At surgeon's request and post-op pain management  Laterality: Left  Prep: chloraprep       Needles:  Injection technique: Single-shot  Needle Type: Echogenic Stimulator Needle     Needle Length: 9cm  Needle Gauge: 21     Additional Needles:   Procedures:,,,, ultrasound used (permanent image in chart),,,,  Narrative:  Start time: 02/10/2020 1:25 PM End time: 02/10/2020 1:30 PM Injection made incrementally with aspirations every 5 mL.  Performed by: Personally  Anesthesiologist: Shelton Silvas, MD  Additional Notes: Patient tolerated the procedure well. Local anesthetic introduced in an incremental fashion under minimal resistance after negative aspirations. No paresthesias were elicited. After completion of the procedure, no acute issues were identified and patient continued to be monitored by RN.

## 2020-02-10 NOTE — ED Notes (Signed)
Right ankle swollen = painful -
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
## 2020-02-11 ENCOUNTER — Inpatient Hospital Stay (HOSPITAL_COMMUNITY): Payer: Medicaid Other | Admitting: Certified Registered"

## 2020-02-11 ENCOUNTER — Encounter (HOSPITAL_COMMUNITY): Admission: EM | Disposition: A | Discharge status: Home / Self Care | Payer: Self-pay | Source: Home / Self Care

## 2020-02-11 ENCOUNTER — Inpatient Hospital Stay (HOSPITAL_COMMUNITY): Payer: Medicaid Other

## 2020-02-11 ENCOUNTER — Encounter (HOSPITAL_COMMUNITY): Payer: Self-pay

## 2020-02-11 DIAGNOSIS — N179 Acute kidney failure, unspecified: Secondary | ICD-10-CM | POA: Diagnosis not present

## 2020-02-11 DIAGNOSIS — S27321A Contusion of lung, unilateral, initial encounter: Secondary | ICD-10-CM | POA: Diagnosis not present

## 2020-02-11 DIAGNOSIS — Z20822 Contact with and (suspected) exposure to covid-19: Secondary | ICD-10-CM | POA: Diagnosis not present

## 2020-02-11 DIAGNOSIS — S52572B Other intraarticular fracture of lower end of left radius, initial encounter for open fracture type I or II: Secondary | ICD-10-CM | POA: Diagnosis not present

## 2020-02-11 HISTORY — PX: SCROTAL EXPLORATION: SHX2386

## 2020-02-11 LAB — SURGICAL PCR SCREEN
MRSA, PCR: NEGATIVE
Staphylococcus aureus: POSITIVE — AB

## 2020-02-11 SURGERY — EXPLORATION, SCROTUM
Anesthesia: General | Site: Groin | Laterality: Right

## 2020-02-11 MED ORDER — DEXAMETHASONE SODIUM PHOSPHATE 10 MG/ML IJ SOLN
INTRAMUSCULAR | Status: DC | PRN
  Administered 2020-02-11: 4 mg via INTRAVENOUS

## 2020-02-11 MED ORDER — OXYCODONE HCL 5 MG PO TABS: 5.0000 mg | ORAL_TABLET | Freq: Once | ORAL | Status: DC | PRN

## 2020-02-11 MED ORDER — ONDANSETRON HCL 4 MG/2ML IJ SOLN
INTRAMUSCULAR | Status: DC | PRN
  Administered 2020-02-11: 4 mg via INTRAVENOUS

## 2020-02-11 MED ORDER — FENTANYL CITRATE (PF) 250 MCG/5ML IJ SOLN
INTRAMUSCULAR | Status: AC
  Filled 2020-02-11: qty 5

## 2020-02-11 MED ORDER — PHENYLEPHRINE 40 MCG/ML (10ML) SYRINGE FOR IV PUSH (FOR BLOOD PRESSURE SUPPORT)
PREFILLED_SYRINGE | INTRAVENOUS | Status: DC | PRN
  Administered 2020-02-11 (×4): 80 ug via INTRAVENOUS

## 2020-02-11 MED ORDER — MUPIROCIN 2 % EX OINT
1.0000 "application " | TOPICAL_OINTMENT | Freq: Two times a day (BID) | CUTANEOUS | Status: DC
  Administered 2020-02-11 – 2020-02-14 (×6): 1 via NASAL
  Filled 2020-02-11 (×3): qty 22

## 2020-02-11 MED ORDER — CHLORHEXIDINE GLUCONATE 0.12 % MT SOLN
OROMUCOSAL | Status: AC
  Filled 2020-02-11: qty 15

## 2020-02-11 MED ORDER — PROPOFOL 10 MG/ML IV BOLUS
INTRAVENOUS | Status: AC
  Filled 2020-02-11: qty 20

## 2020-02-11 MED ORDER — LIDOCAINE 2% (20 MG/ML) 5 ML SYRINGE
INTRAMUSCULAR | Status: DC | PRN
  Administered 2020-02-11: 60 mg via INTRAVENOUS

## 2020-02-11 MED ORDER — SUGAMMADEX SODIUM 200 MG/2ML IV SOLN
INTRAVENOUS | Status: DC | PRN
  Administered 2020-02-11: 200 mg via INTRAVENOUS

## 2020-02-11 MED ORDER — BUPIVACAINE HCL (PF) 0.25 % IJ SOLN
INTRAMUSCULAR | Status: AC
  Filled 2020-02-11: qty 20

## 2020-02-11 MED ORDER — CEFAZOLIN SODIUM-DEXTROSE 2-4 GM/100ML-% IV SOLN
INTRAVENOUS | Status: AC
  Filled 2020-02-11: qty 100

## 2020-02-11 MED ORDER — ACETAMINOPHEN 500 MG PO TABS
1000.0000 mg | ORAL_TABLET | Freq: Three times a day (TID) | ORAL | Status: DC
  Administered 2020-02-11 – 2020-02-14 (×9): 1000 mg via ORAL
  Filled 2020-02-11 (×10): qty 2

## 2020-02-11 MED ORDER — ROCURONIUM BROMIDE 10 MG/ML (PF) SYRINGE
PREFILLED_SYRINGE | INTRAVENOUS | Status: DC | PRN
  Administered 2020-02-11: 40 mg via INTRAVENOUS

## 2020-02-11 MED ORDER — HYDROMORPHONE HCL 1 MG/ML IJ SOLN: 0.2500 mg | INTRAMUSCULAR | Status: DC | PRN

## 2020-02-11 MED ORDER — BUPIVACAINE HCL (PF) 0.25 % IJ SOLN
INTRAMUSCULAR | Status: DC | PRN
  Administered 2020-02-11: 10 mL

## 2020-02-11 MED ORDER — MIDAZOLAM HCL 2 MG/2ML IJ SOLN
INTRAMUSCULAR | Status: AC
  Filled 2020-02-11: qty 2

## 2020-02-11 MED ORDER — CHLORHEXIDINE GLUCONATE CLOTH 2 % EX PADS
6.0000 | MEDICATED_PAD | Freq: Every day | CUTANEOUS | Status: DC
  Administered 2020-02-13 – 2020-02-14 (×2): 6 via TOPICAL

## 2020-02-11 MED ORDER — MIDAZOLAM HCL 5 MG/5ML IJ SOLN
INTRAMUSCULAR | Status: DC | PRN
  Administered 2020-02-11: 2 mg via INTRAVENOUS

## 2020-02-11 MED ORDER — BUPIVACAINE HCL (PF) 0.25 % IJ SOLN
INTRAMUSCULAR | Status: AC
  Filled 2020-02-11: qty 30

## 2020-02-11 MED ORDER — FENTANYL CITRATE (PF) 250 MCG/5ML IJ SOLN
INTRAMUSCULAR | Status: DC | PRN
Start: 2020-02-11 — End: 2020-02-11
  Administered 2020-02-11 (×2): 50 ug via INTRAVENOUS

## 2020-02-11 MED ORDER — CEFAZOLIN SODIUM-DEXTROSE 2-3 GM-%(50ML) IV SOLR
INTRAVENOUS | Status: DC | PRN
  Administered 2020-02-11: 2 g via INTRAVENOUS

## 2020-02-11 MED ORDER — PROPOFOL 10 MG/ML IV BOLUS
INTRAVENOUS | Status: DC | PRN
  Administered 2020-02-11: 150 mg via INTRAVENOUS

## 2020-02-11 MED ORDER — SUCCINYLCHOLINE CHLORIDE 200 MG/10ML IV SOSY
PREFILLED_SYRINGE | INTRAVENOUS | Status: DC | PRN
  Administered 2020-02-11: 120 mg via INTRAVENOUS

## 2020-02-11 MED ORDER — ONDANSETRON HCL 4 MG/2ML IJ SOLN: 4.0000 mg | Freq: Once | INTRAMUSCULAR | Status: DC | PRN

## 2020-02-11 MED ORDER — ACETAMINOPHEN 325 MG PO TABS: 325.0000 mg | ORAL_TABLET | ORAL | Status: DC | PRN

## 2020-02-11 MED ORDER — OXYCODONE HCL 5 MG/5ML PO SOLN: 5.0000 mg | Freq: Once | ORAL | Status: DC | PRN

## 2020-02-11 MED ORDER — 0.9 % SODIUM CHLORIDE (POUR BTL) OPTIME
TOPICAL | Status: DC | PRN
  Administered 2020-02-11: 1000 mL

## 2020-02-11 MED ORDER — ACETAMINOPHEN 160 MG/5ML PO SOLN: 325.0000 mg | ORAL | Status: DC | PRN

## 2020-02-11 MED ORDER — METHOCARBAMOL 500 MG PO TABS
1000.0000 mg | ORAL_TABLET | Freq: Three times a day (TID) | ORAL | Status: DC
  Administered 2020-02-11 – 2020-02-14 (×8): 1000 mg via ORAL
  Filled 2020-02-11 (×8): qty 2

## 2020-02-11 SURGICAL SUPPLY — 28 items
BLADE CLIPPER SURG (BLADE) ×3 IMPLANT
BLADE HEX COATED 2.75 (ELECTRODE) ×3 IMPLANT
BNDG GAUZE ELAST 4 BULKY (GAUZE/BANDAGES/DRESSINGS) IMPLANT
COVER SURGICAL LIGHT HANDLE (MISCELLANEOUS) ×3 IMPLANT
COVER WAND RF STERILE (DRAPES) IMPLANT
DERMABOND ADVANCED (GAUZE/BANDAGES/DRESSINGS) ×2
DERMABOND ADVANCED .7 DNX12 (GAUZE/BANDAGES/DRESSINGS) ×1 IMPLANT
DRAIN PENROSE 0.25X18 (DRAIN) ×3 IMPLANT
DRAPE LAPAROTOMY 100X72 PEDS (DRAPES) ×3 IMPLANT
ELECT REM PT RETURN 15FT ADLT (MISCELLANEOUS) ×3 IMPLANT
GLOVE BIO SURGEON STRL SZ7.5 (GLOVE) ×6 IMPLANT
GOWN STRL REUS W/ TWL LRG LVL3 (GOWN DISPOSABLE) ×1 IMPLANT
GOWN STRL REUS W/TWL LRG LVL3 (GOWN DISPOSABLE) ×2
KIT BASIN OR (CUSTOM PROCEDURE TRAY) ×3 IMPLANT
NEEDLE HYPO 22GX1.5 SAFETY (NEEDLE) IMPLANT
NS IRRIG 1000ML POUR BTL (IV SOLUTION) ×3 IMPLANT
PACK GENERAL/GYN (CUSTOM PROCEDURE TRAY) ×3 IMPLANT
SOL PREP POV-IOD 4OZ 10% (MISCELLANEOUS) ×3 IMPLANT
SUPPORT SCROTAL LG STRP (MISCELLANEOUS) ×2 IMPLANT
SUPPORTER ATHLETIC LG (MISCELLANEOUS) ×1
SUT CHROMIC 3 0 SH 27 (SUTURE) ×6 IMPLANT
SUT PDS AB 4-0 SH 27 (SUTURE) ×3 IMPLANT
SUT PROLENE 4 0 SH DA (SUTURE) ×3 IMPLANT
SUT VIC AB 2-0 UR5 27 (SUTURE) IMPLANT
SUT VICRYL 0 TIES 12 18 (SUTURE) IMPLANT
SYR CONTROL 10ML LL (SYRINGE) ×3 IMPLANT
TOWEL GREEN STERILE (TOWEL DISPOSABLE) ×6 IMPLANT
WATER STERILE IRR 1000ML POUR (IV SOLUTION) ×3 IMPLANT
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
## 2020-02-11 NOTE — Progress Notes (Signed)
PT Cancellation Note  Patient Details Name: Robert Oliver MRN: [id number] DOB: [birth year]   Cancelled Treatment:    Reason Eval/Treat Not Completed: Medical issues which prohibited therapy (Patient to undergo a procedure 9/14. PA-C requesting to hold. Will follow.)  Tamala Ser PT 02/11/2020  Acute Rehabilitation Services Pager [phone] Office [phone]

## 2020-02-11 NOTE — TOC CAGE-AID Note (Signed)
Transition of Care [facility]) - CAGE-AID Screening   Patient Details  Name: Robert Oliver MRN: [id number] Date of Birth: 03/27/1996  Transition of Care [facility] CM/SW Contact:    Emeterio Reeve, [place] Phone Number: 02/11/2020, 2:18 PM   Clinical Narrative:  CSW met with pt at bedside. CSW introduced self and explained her role at the hospital.  Pt reports alcohol use of a couple times a month. Pt denies alcohol use. Pt declined resources and Scientist, clinical (histocompatibility and immunogenetics).  CAGE-AID Screening:    Have You Ever Felt You Ought to Cut Down on Your Drinking or Drug Use?: Yes Have People Annoyed You By Critizing Your Drinking Or Drug Use?: No Have You Felt Bad Or Guilty About Your Drinking Or Drug Use?: No Have You Ever Had a Drink or Used Drugs First Thing In The Morning to Steady Your Nerves or to Get Rid of a Hangover?: No CAGE-AID Score: 1  Substance Abuse Education Offered: Yes  Substance abuse interventions: Patient Counseling   Emeterio Reeve, Latanya Presser, [place] Social Worker [phone]

## 2020-02-11 NOTE — Progress Notes (Signed)
11:47 AM I received a call from radiology with findings of fracture to patients right testicle with devitalized inferior pole and adjacent small hematoma. I have made the patient NPO and paged Urology. Will update the patient on findings.   Leary Roca, [facility].
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
## 2020-02-11 NOTE — Progress Notes (Signed)
OT Cancellation Note  Patient Details Name: Robert Oliver MRN: [id number] DOB: 1995-08-29   Cancelled Treatment:    Reason Eval/Treat Not Completed: Patient not medically ready (Patient to undergo a procedure 9/14. PA-C requesting to hold)  OT to continue to follow in the acute setting.    [name] D Riti Rollyson 02/11/2020, 11:55 AM  02/11/2020  Rich, OTR/L  Acute Rehabilitation Services  Office:  [phone]
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
## 2020-02-11 NOTE — Anesthesia Postprocedure Evaluation (Signed)
Anesthesia Post Note  Patient: Robert Oliver  Procedure(s) Performed: SCROTUM EXPLORATION WITH RIGHT TESTICULAR REPAIR (Right Groin)     Patient location during evaluation: PACU Anesthesia Type: General Level of consciousness: awake and alert Pain management: pain level controlled Vital Signs Assessment: post-procedure vital signs reviewed and stable Respiratory status: spontaneous breathing, nonlabored ventilation, respiratory function stable and patient connected to nasal cannula oxygen Cardiovascular status: blood pressure returned to baseline and stable Postop Assessment: no apparent nausea or vomiting Anesthetic complications: no   No complications documented.  Last Vitals:  Vitals:   02/11/20 1800 02/11/20 1904  BP: (!) 145/93 (!) 138/102  Pulse: 94 84  Resp: (!) 9 16  Temp:  37.2 C  SpO2: 97% 99%    Last Pain:  Vitals:   02/11/20 1904  TempSrc: Oral  PainSc:                  Nelle Don Juniel Groene

## 2020-02-11 NOTE — Transfer of Care (Signed)
Immediate Anesthesia Transfer of Care Note  Patient: Robert Oliver  Procedure(s) Performed: SCROTUM EXPLORATION WITH RIGHT TESTICULAR REPAIR (Right Groin)  Patient Location: PACU  Anesthesia Type:General  Level of Consciousness: awake, alert , oriented and patient cooperative  Airway & Oxygen Therapy: Patient Spontanous Breathing and Patient connected to face mask oxygen  Post-op Assessment: Report given to RN and Post -op Vital signs reviewed and stable  Post vital signs: Reviewed and stable  Last Vitals:  Vitals Value Taken Time  BP 129/81 02/11/20 1728  Temp    Pulse 101 02/11/20 1730  Resp 13 02/11/20 1730  SpO2 100 % 02/11/20 1730  Vitals shown include unvalidated device data.  Last Pain:  Vitals:   02/11/20 1452  TempSrc: Oral  PainSc:       Patients Stated Pain Goal: 2 (02/10/20 1232)  Complications: No complications documented.

## 2020-02-11 NOTE — Anesthesia Procedure Notes (Signed)
Procedure Name: Intubation Date/Time: 02/11/2020 4:18 PM Performed by: Wilburn Cornelia, CRNA Pre-anesthesia Checklist: Patient identified, Emergency Drugs available, Suction available, Patient being monitored and Timeout performed Patient Re-evaluated:Patient Re-evaluated prior to induction Oxygen Delivery Method: Circle system utilized Preoxygenation: Pre-oxygenation with 100% oxygen Induction Type: IV induction and Rapid sequence Laryngoscope Size: Mac and 4 Grade View: Grade I Tube type: Oral Tube size: 7.5 mm Number of attempts: 1 Airway Equipment and Method: Stylet Placement Confirmation: ETT inserted through vocal cords under direct vision,  positive ETCO2,  CO2 detector and breath sounds checked- equal and bilateral Secured at: 23 cm Tube secured with: Tape Dental Injury: Teeth and Oropharynx as per pre-operative assessment
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
## 2020-02-12 ENCOUNTER — Encounter (HOSPITAL_COMMUNITY): Payer: Self-pay | Admitting: Urology

## 2020-02-12 DIAGNOSIS — E559 Vitamin D deficiency, unspecified: Secondary | ICD-10-CM | POA: Diagnosis present

## 2020-02-12 DIAGNOSIS — S8251XA Displaced fracture of medial malleolus of right tibia, initial encounter for closed fracture: Secondary | ICD-10-CM

## 2020-02-12 DIAGNOSIS — S52302B Unspecified fracture of shaft of left radius, initial encounter for open fracture type I or II: Secondary | ICD-10-CM

## 2020-02-12 HISTORY — DX: Unspecified fracture of shaft of left radius, initial encounter for open fracture type I or II: S52.302B

## 2020-02-12 HISTORY — DX: Displaced fracture of medial malleolus of right tibia, initial encounter for closed fracture: S82.51XA

## 2020-02-12 HISTORY — DX: Vitamin D deficiency, unspecified: E55.9

## 2020-02-12 LAB — CBC
HCT: 33.8 % — ABNORMAL LOW (ref 39.0–52.0)
Hemoglobin: 11.5 g/dL — ABNORMAL LOW (ref 13.0–17.0)
MCH: 31 pg (ref 26.0–34.0)
MCHC: 34 g/dL (ref 30.0–36.0)
MCV: 91.1 fL (ref 80.0–100.0)
Platelets: 156 10*3/uL (ref 150–400)
RBC: 3.71 MIL/uL — ABNORMAL LOW (ref 4.22–5.81)
RDW: 11.3 % — ABNORMAL LOW (ref 11.5–15.5)
WBC: 13.7 10*3/uL — ABNORMAL HIGH (ref 4.0–10.5)
nRBC: 0 % (ref 0.0–0.2)

## 2020-02-12 LAB — VITAMIN D 25 HYDROXY (VIT D DEFICIENCY, FRACTURES): Vit D, 25-Hydroxy: 6.82 ng/mL — ABNORMAL LOW (ref 30–100)

## 2020-02-12 MED ORDER — VITAMIN D 25 MCG (1000 UNIT) PO TABS
2000.0000 [IU] | ORAL_TABLET | Freq: Two times a day (BID) | ORAL | Status: DC
  Administered 2020-02-12 – 2020-02-14 (×4): 2000 [IU] via ORAL
  Filled 2020-02-12 (×4): qty 2

## 2020-02-12 MED ORDER — CEFAZOLIN SODIUM-DEXTROSE 2-4 GM/100ML-% IV SOLN
2.0000 g | INTRAVENOUS | Status: AC
  Administered 2020-02-13: 2 g via INTRAVENOUS
  Filled 2020-02-12 (×2): qty 100

## 2020-02-12 MED ORDER — VITAMIN D (ERGOCALCIFEROL) 1.25 MG (50000 UNIT) PO CAPS
50000.0000 [IU] | ORAL_CAPSULE | ORAL | Status: DC
  Filled 2020-02-12: qty 1

## 2020-02-12 MED ORDER — POLYETHYLENE GLYCOL 3350 17 G PO PACK
17.0000 g | PACK | Freq: Every day | ORAL | Status: DC
  Administered 2020-02-12 – 2020-02-14 (×3): 17 g via ORAL
  Filled 2020-02-12 (×3): qty 1

## 2020-02-12 MED ORDER — ASCORBIC ACID 500 MG PO TABS
1000.0000 mg | ORAL_TABLET | Freq: Every day | ORAL | Status: DC
  Administered 2020-02-13 – 2020-02-14 (×2): 1000 mg via ORAL
  Filled 2020-02-12 (×2): qty 2
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
## 2020-02-13 ENCOUNTER — Inpatient Hospital Stay (HOSPITAL_COMMUNITY): Payer: Medicaid Other

## 2020-02-13 ENCOUNTER — Encounter (HOSPITAL_COMMUNITY): Admission: EM | Disposition: A | Discharge status: Home / Self Care | Payer: Self-pay | Source: Home / Self Care

## 2020-02-13 ENCOUNTER — Inpatient Hospital Stay (HOSPITAL_COMMUNITY): Payer: Medicaid Other | Admitting: Anesthesiology

## 2020-02-13 ENCOUNTER — Encounter (HOSPITAL_COMMUNITY): Payer: Self-pay

## 2020-02-13 DIAGNOSIS — S52572B Other intraarticular fracture of lower end of left radius, initial encounter for open fracture type I or II: Secondary | ICD-10-CM | POA: Diagnosis not present

## 2020-02-13 DIAGNOSIS — N179 Acute kidney failure, unspecified: Secondary | ICD-10-CM | POA: Diagnosis not present

## 2020-02-13 DIAGNOSIS — S27321A Contusion of lung, unilateral, initial encounter: Secondary | ICD-10-CM | POA: Diagnosis not present

## 2020-02-13 DIAGNOSIS — Z20822 Contact with and (suspected) exposure to covid-19: Secondary | ICD-10-CM | POA: Diagnosis not present

## 2020-02-13 HISTORY — PX: ORIF ANKLE FRACTURE: SHX5408

## 2020-02-13 LAB — BASIC METABOLIC PANEL
Anion gap: 8 (ref 5–15)
BUN: 5 mg/dL — ABNORMAL LOW (ref 6–20)
CO2: 28 mmol/L (ref 22–32)
Calcium: 9 mg/dL (ref 8.9–10.3)
Chloride: 100 mmol/L (ref 98–111)
Creatinine, Ser: 0.85 mg/dL (ref 0.61–1.24)
GFR calc Af Amer: >60 mL/min (ref >60)
GFR calc non Af Amer: >60 mL/min (ref >60)
Glucose, Bld: 97 mg/dL (ref 70–99)
Potassium: 3.8 mmol/L (ref 3.5–5.1)
Sodium: 136 mmol/L (ref 135–145)

## 2020-02-13 LAB — CBC
HCT: 30.2 % — ABNORMAL LOW (ref 39.0–52.0)
Hemoglobin: 10.2 g/dL — ABNORMAL LOW (ref 13.0–17.0)
MCH: 30.3 pg (ref 26.0–34.0)
MCHC: 33.8 g/dL (ref 30.0–36.0)
MCV: 89.6 fL (ref 80.0–100.0)
Platelets: 159 10*3/uL (ref 150–400)
RBC: 3.37 MIL/uL — ABNORMAL LOW (ref 4.22–5.81)
RDW: 11.2 % — ABNORMAL LOW (ref 11.5–15.5)
WBC: 8.4 10*3/uL (ref 4.0–10.5)
nRBC: 0 % (ref 0.0–0.2)

## 2020-02-13 SURGERY — OPEN REDUCTION INTERNAL FIXATION (ORIF) ANKLE FRACTURE
Anesthesia: Choice | Site: Ankle | Laterality: Right

## 2020-02-13 MED ORDER — DEXAMETHASONE SODIUM PHOSPHATE 10 MG/ML IJ SOLN
INTRAMUSCULAR | Status: AC
  Filled 2020-02-13: qty 1

## 2020-02-13 MED ORDER — FENTANYL CITRATE (PF) 100 MCG/2ML IJ SOLN: 25.0000 ug | INTRAMUSCULAR | Status: DC | PRN

## 2020-02-13 MED ORDER — FENTANYL CITRATE (PF) 250 MCG/5ML IJ SOLN
INTRAMUSCULAR | Status: AC
  Filled 2020-02-13: qty 5

## 2020-02-13 MED ORDER — CHLORHEXIDINE GLUCONATE 0.12 % MT SOLN: 15.0000 mL | Freq: Once | OROMUCOSAL | Status: AC

## 2020-02-13 MED ORDER — ROCURONIUM BROMIDE 10 MG/ML (PF) SYRINGE
PREFILLED_SYRINGE | INTRAVENOUS | Status: AC
  Filled 2020-02-13: qty 10

## 2020-02-13 MED ORDER — DEXAMETHASONE SODIUM PHOSPHATE 10 MG/ML IJ SOLN
INTRAMUSCULAR | Status: DC | PRN
  Administered 2020-02-13: 10 mg via INTRAVENOUS

## 2020-02-13 MED ORDER — ORAL CARE MOUTH RINSE: 15.0000 mL | Freq: Once | OROMUCOSAL | Status: AC

## 2020-02-13 MED ORDER — ONDANSETRON HCL 4 MG/2ML IJ SOLN
INTRAMUSCULAR | Status: AC
  Filled 2020-02-13: qty 2

## 2020-02-13 MED ORDER — ROCURONIUM BROMIDE 10 MG/ML (PF) SYRINGE
PREFILLED_SYRINGE | INTRAVENOUS | Status: DC | PRN
  Administered 2020-02-13: 60 mg via INTRAVENOUS

## 2020-02-13 MED ORDER — 0.9 % SODIUM CHLORIDE (POUR BTL) OPTIME
TOPICAL | Status: DC | PRN
  Administered 2020-02-13: 1000 mL

## 2020-02-13 MED ORDER — PROPOFOL 10 MG/ML IV BOLUS
INTRAVENOUS | Status: AC
  Filled 2020-02-13: qty 20

## 2020-02-13 MED ORDER — LIDOCAINE 2% (20 MG/ML) 5 ML SYRINGE
INTRAMUSCULAR | Status: DC | PRN
  Administered 2020-02-13: 40 mg via INTRAVENOUS

## 2020-02-13 MED ORDER — FENTANYL CITRATE (PF) 250 MCG/5ML IJ SOLN
INTRAMUSCULAR | Status: DC | PRN
Start: 2020-02-13 — End: 2020-02-13
  Administered 2020-02-13 (×2): 100 ug via INTRAVENOUS
  Administered 2020-02-13: 50 ug via INTRAVENOUS

## 2020-02-13 MED ORDER — ONDANSETRON HCL 4 MG/2ML IJ SOLN: 4.0000 mg | Freq: Once | INTRAMUSCULAR | Status: DC | PRN

## 2020-02-13 MED ORDER — CHLORHEXIDINE GLUCONATE 0.12 % MT SOLN
OROMUCOSAL | Status: AC
  Administered 2020-02-13: 15 mL via OROMUCOSAL
  Filled 2020-02-13: qty 15

## 2020-02-13 MED ORDER — MIDAZOLAM HCL 2 MG/2ML IJ SOLN
INTRAMUSCULAR | Status: AC
  Filled 2020-02-13: qty 2

## 2020-02-13 MED ORDER — MIDAZOLAM HCL 5 MG/5ML IJ SOLN
INTRAMUSCULAR | Status: DC | PRN
  Administered 2020-02-13: 2 mg via INTRAVENOUS

## 2020-02-13 MED ORDER — SUGAMMADEX SODIUM 200 MG/2ML IV SOLN
INTRAVENOUS | Status: DC | PRN
  Administered 2020-02-13: 200 mg via INTRAVENOUS

## 2020-02-13 MED ORDER — LACTATED RINGERS IV SOLN: INTRAVENOUS | Status: DC

## 2020-02-13 MED ORDER — FENTANYL CITRATE (PF) 100 MCG/2ML IJ SOLN
INTRAMUSCULAR | Status: DC
Start: 2020-02-13 — End: 2020-02-13
  Filled 2020-02-13: qty 2

## 2020-02-13 MED ORDER — PROPOFOL 10 MG/ML IV BOLUS
INTRAVENOUS | Status: DC | PRN
  Administered 2020-02-13: 170 mg via INTRAVENOUS

## 2020-02-13 MED ORDER — LIDOCAINE 2% (20 MG/ML) 5 ML SYRINGE
INTRAMUSCULAR | Status: AC
  Filled 2020-02-13: qty 5

## 2020-02-13 MED ORDER — CEFAZOLIN SODIUM-DEXTROSE 2-4 GM/100ML-% IV SOLN
2.0000 g | Freq: Three times a day (TID) | INTRAVENOUS | Status: AC
  Administered 2020-02-13 – 2020-02-14 (×3): 2 g via INTRAVENOUS
  Filled 2020-02-13 (×3): qty 100

## 2020-02-13 SURGICAL SUPPLY — 60 items
BANDAGE ESMARK 6X9 LF (GAUZE/BANDAGES/DRESSINGS) ×1 IMPLANT
BIT DRILL CANNULTD 2.6 X 130MM (DRILL) ×1 IMPLANT
BIT DRILL SOLID 2.0 X 110MM (DRILL) ×1 IMPLANT
BNDG ELASTIC 4X5.8 VLCR STR LF (GAUZE/BANDAGES/DRESSINGS) ×2 IMPLANT
BNDG ELASTIC 6X5.8 VLCR STR LF (GAUZE/BANDAGES/DRESSINGS) ×2 IMPLANT
BNDG ESMARK 6X9 LF (GAUZE/BANDAGES/DRESSINGS) ×2
BNDG GAUZE ELAST 4 BULKY (GAUZE/BANDAGES/DRESSINGS) ×8 IMPLANT
BNDG STRETCH 4X75 NS LF (GAUZE/BANDAGES/DRESSINGS) ×2 IMPLANT
BRUSH SCRUB EZ PLAIN DRY (MISCELLANEOUS) ×4 IMPLANT
COVER MAYO STAND STRL (DRAPES) ×2 IMPLANT
COVER SURGICAL LIGHT HANDLE (MISCELLANEOUS) ×2 IMPLANT
COVER WAND RF STERILE (DRAPES) ×2 IMPLANT
CUFF TOURN SGL QUICK 34 (TOURNIQUET CUFF) ×1
CUFF TRNQT CYL 34X4.125X (TOURNIQUET CUFF) ×1 IMPLANT
DRAPE C-ARM 42X72 X-RAY (DRAPES) ×2 IMPLANT
DRAPE C-ARMOR (DRAPES) ×2 IMPLANT
DRAPE HALF SHEET 40X57 (DRAPES) ×2 IMPLANT
DRAPE U-SHAPE 47X51 STRL (DRAPES) ×2 IMPLANT
DRILL CANNULATED 2.6 X 130MM (DRILL) ×2
DRILL SOLID 2.0 X 110MM (DRILL) ×2
DRSG ADAPTIC 3X8 NADH LF (GAUZE/BANDAGES/DRESSINGS) ×2 IMPLANT
ELECT REM PT RETURN 9FT ADLT (ELECTROSURGICAL) ×2
ELECTRODE REM PT RTRN 9FT ADLT (ELECTROSURGICAL) ×1 IMPLANT
GAUZE SPONGE 4X4 12PLY STRL (GAUZE/BANDAGES/DRESSINGS) ×8 IMPLANT
GLOVE BIO SURGEON STRL SZ7.5 (GLOVE) ×2 IMPLANT
GLOVE BIO SURGEON STRL SZ8 (GLOVE) ×2 IMPLANT
GLOVE BIOGEL PI IND STRL 7.5 (GLOVE) ×1 IMPLANT
GLOVE BIOGEL PI IND STRL 8 (GLOVE) ×1 IMPLANT
GLOVE BIOGEL PI INDICATOR 7.5 (GLOVE) ×1
GLOVE BIOGEL PI INDICATOR 8 (GLOVE) ×1
GOWN STRL REUS W/ TWL LRG LVL3 (GOWN DISPOSABLE) ×2 IMPLANT
GOWN STRL REUS W/ TWL XL LVL3 (GOWN DISPOSABLE) ×1 IMPLANT
GOWN STRL REUS W/TWL LRG LVL3 (GOWN DISPOSABLE) ×2
GOWN STRL REUS W/TWL XL LVL3 (GOWN DISPOSABLE) ×1
K-WIRE SNGL END 1.2X150 (MISCELLANEOUS) ×6
KIT BASIN OR (CUSTOM PROCEDURE TRAY) ×2 IMPLANT
KIT TURNOVER KIT B (KITS) ×2 IMPLANT
KWIRE SNGL END 1.2X150 (MISCELLANEOUS) ×3 IMPLANT
MANIFOLD NEPTUNE II (INSTRUMENTS) ×2 IMPLANT
NS IRRIG 1000ML POUR BTL (IV SOLUTION) ×2 IMPLANT
PACK GENERAL/GYN (CUSTOM PROCEDURE TRAY) ×2 IMPLANT
PACK ORTHO EXTREMITY (CUSTOM PROCEDURE TRAY) ×2 IMPLANT
PAD ABD 7.5X8 STRL (GAUZE/BANDAGES/DRESSINGS) ×8 IMPLANT
PAD ARMBOARD 7.5X6 YLW CONV (MISCELLANEOUS) ×4 IMPLANT
PAD CAST 4YDX4 CTTN HI CHSV (CAST SUPPLIES) ×1 IMPLANT
PADDING CAST COTTON 4X4 STRL (CAST SUPPLIES) ×1
PADDING CAST COTTON 6X4 STRL (CAST SUPPLIES) ×2 IMPLANT
SCREW 4.0X28 STRL (Screw) ×4 IMPLANT
SPONGE LAP 18X18 RF (DISPOSABLE) ×2 IMPLANT
SUCTION FRAZIER HANDLE 10FR (MISCELLANEOUS) ×1
SUCTION TUBE FRAZIER 10FR DISP (MISCELLANEOUS) ×1 IMPLANT
SUT ETHILON 2 0 FS 18 (SUTURE) ×2 IMPLANT
SUT ETHILON 3 0 PS 1 (SUTURE) ×4 IMPLANT
SUT VIC AB 2-0 CT1 27 (SUTURE) ×2
SUT VIC AB 2-0 CT1 TAPERPNT 27 (SUTURE) ×2 IMPLANT
TOWEL GREEN STERILE (TOWEL DISPOSABLE) ×4 IMPLANT
TOWEL GREEN STERILE FF (TOWEL DISPOSABLE) ×2 IMPLANT
TUBE CONNECTING 12X1/4 (SUCTIONS) ×2 IMPLANT
UNDERPAD 30X36 HEAVY ABSORB (UNDERPADS AND DIAPERS) ×2 IMPLANT
WATER STERILE IRR 1000ML POUR (IV SOLUTION) ×2 IMPLANT

## 2020-02-13 NOTE — Progress Notes (Signed)
Patient states that his testicle is feeling much better.  His drain has been removed.  Small amount of drainage from the drain site.  Mild right-sided testicular swelling consistent with prior surgery.  Normal left testicle.  Okay to follow-up in clinic in about 1 month for postoperative examination.

## 2020-02-13 NOTE — Anesthesia Procedure Notes (Signed)
Procedure Name: Intubation Date/Time: 02/13/2020 8:23 AM Performed by: Shary Decamp, CRNA Pre-anesthesia Checklist: Patient identified, Emergency Drugs available, Suction available and Patient being monitored Patient Re-evaluated:Patient Re-evaluated prior to induction Oxygen Delivery Method: Circle system utilized Preoxygenation: Pre-oxygenation with 100% oxygen Induction Type: IV induction Ventilation: Mask ventilation without difficulty Laryngoscope Size: Miller and 2 Grade View: Grade I Tube type: Oral Tube size: 7.5 mm Number of attempts: 1 Airway Equipment and Method: Stylet and Oral airway Placement Confirmation: ETT inserted through vocal cords under direct vision,  positive ETCO2 and breath sounds checked- equal and bilateral Secured at: 21 cm Tube secured with: Tape Dental Injury: Teeth and Oropharynx as per pre-operative assessment
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
## 2020-02-13 NOTE — Plan of Care (Signed)

## 2020-02-13 NOTE — Progress Notes (Signed)
Orthopedic Tech Progress Note Patient Details:  DESTINY TRICKEY [birth year] [id number]  Ortho Devices Type of Ortho Device: Abduction pillow Ortho Device/Splint Location: LUE Ortho Device/Splint Interventions: Ordered, Application, Adjustment   Post Interventions Patient Tolerated: Well Instructions Provided: Care of device, Adjustment of device, Poper ambulation with device   Sina Lucchesi 02/13/2020, 12:49 PM

## 2020-02-13 NOTE — Progress Notes (Signed)
I discussed with the patient the risks and benefits of surgery vs nonoperative treatment for his right ankle, including the possibility of infection, nerve injury, vessel injury, wound breakdown, arthritis, symptomatic hardware, DVT/ PE, loss of motion, malunion, nonunion, and need for further surgery among others.  He acknowledged these risks and wished to proceed.  Myrene Galas, MD [facility], [facility] [phone]

## 2020-02-13 NOTE — Transfer of Care (Signed)
Immediate Anesthesia Transfer of Care Note  Patient: Robert Oliver  Procedure(s) Performed: ORIF medial malleolus (Right Ankle)  Patient Location: PACU  Anesthesia Type:General  Level of Consciousness: drowsy, patient cooperative and responds to stimulation  Airway & Oxygen Therapy: Patient Spontanous Breathing and Patient connected to nasal cannula oxygen  Post-op Assessment: Report given to RN and Post -op Vital signs reviewed and stable  Post vital signs: Reviewed and stable  Last Vitals:  Vitals Value Taken Time  BP 148/86 02/13/20 1006  Temp 36.2 C 02/13/20 1006  Pulse 88 02/13/20 1008  Resp 15 02/13/20 1008  SpO2 100 % 02/13/20 1008  Vitals shown include unvalidated device data.  Last Pain:  Vitals:   02/13/20 0655  TempSrc:   PainSc: 8       Patients Stated Pain Goal: 2 (02/13/20 1962)  Complications: No complications documented.

## 2020-02-14 LAB — HEPATIC FUNCTION PANEL
ALT: 47 U/L — ABNORMAL HIGH (ref 0–44)
AST: 62 U/L — ABNORMAL HIGH (ref 15–41)
Albumin: 2.9 g/dL — ABNORMAL LOW (ref 3.5–5.0)
Alkaline Phosphatase: 31 U/L — ABNORMAL LOW (ref 38–126)
Bilirubin, Direct: 0.1 mg/dL (ref 0.0–0.2)
Indirect Bilirubin: 0.8 mg/dL (ref 0.3–0.9)
Total Bilirubin: 0.9 mg/dL (ref 0.3–1.2)
Total Protein: 6.2 g/dL — ABNORMAL LOW (ref 6.5–8.1)

## 2020-02-14 LAB — CBC
HCT: 26.8 % — ABNORMAL LOW (ref 39.0–52.0)
Hemoglobin: 9.1 g/dL — ABNORMAL LOW (ref 13.0–17.0)
MCH: 30.5 pg (ref 26.0–34.0)
MCHC: 34 g/dL (ref 30.0–36.0)
MCV: 89.9 fL (ref 80.0–100.0)
Platelets: 220 10*3/uL (ref 150–400)
RBC: 2.98 MIL/uL — ABNORMAL LOW (ref 4.22–5.81)
RDW: 11.2 % — ABNORMAL LOW (ref 11.5–15.5)
WBC: 10.5 10*3/uL (ref 4.0–10.5)
nRBC: 0 % (ref 0.0–0.2)

## 2020-02-14 LAB — FIBRINOGEN: Fibrinogen: 621 mg/dL — ABNORMAL HIGH (ref 210–475)

## 2020-02-14 LAB — PROTIME-INR
INR: 1.2 (ref 0.8–1.2)
Prothrombin Time: 15.1 seconds (ref 11.4–15.2)

## 2020-02-14 LAB — APTT: aPTT: 32 seconds (ref 24–36)

## 2020-02-14 MED ORDER — VITAMIN D (ERGOCALCIFEROL) 1.25 MG (50000 UNIT) PO CAPS: 50000.0000 [IU] | ORAL_CAPSULE | ORAL | 0 refills | Status: AC

## 2020-02-14 MED ORDER — POLYETHYLENE GLYCOL 3350 17 G PO PACK: 17.0000 g | PACK | Freq: Every day | ORAL | 0 refills | Status: AC

## 2020-02-14 MED ORDER — VITAMIN D3 25 MCG PO TABS: 2000.0000 [IU] | ORAL_TABLET | Freq: Two times a day (BID) | ORAL | 0 refills | Status: AC

## 2020-02-14 MED ORDER — ACETAMINOPHEN 500 MG PO TABS: 1000.0000 mg | ORAL_TABLET | Freq: Three times a day (TID) | ORAL | 0 refills | Status: AC | PRN

## 2020-02-14 MED ORDER — ASCORBIC ACID 1000 MG PO TABS: 1000.0000 mg | ORAL_TABLET | Freq: Every day | ORAL | 0 refills | Status: AC

## 2020-02-14 MED ORDER — FERROUS SULFATE 325 (65 FE) MG PO TABS: 325.0000 mg | ORAL_TABLET | Freq: Two times a day (BID) | ORAL | 0 refills | Status: AC

## 2020-02-14 MED ORDER — FERROUS SULFATE 325 (65 FE) MG PO TABS: 325.0000 mg | ORAL_TABLET | Freq: Two times a day (BID) | ORAL | Status: DC

## 2020-02-14 MED ORDER — APIXABAN 2.5 MG PO TABS: 2.5000 mg | ORAL_TABLET | Freq: Two times a day (BID) | ORAL | 0 refills | Status: AC

## 2020-02-14 MED ORDER — DOCUSATE SODIUM 100 MG PO CAPS: 100.0000 mg | ORAL_CAPSULE | Freq: Two times a day (BID) | ORAL | 0 refills | Status: AC

## 2020-02-14 MED ORDER — METHOCARBAMOL 500 MG PO TABS: 1000.0000 mg | ORAL_TABLET | Freq: Three times a day (TID) | ORAL | 0 refills | Status: AC | PRN

## 2020-02-14 MED ORDER — OXYCODONE HCL 10 MG PO TABS: 5.0000 mg | ORAL_TABLET | Freq: Four times a day (QID) | ORAL | 0 refills | Status: AC | PRN

## 2020-02-14 MED FILL — VITAMIN C 500 MG TABLET: 500 | 30 days supply | Qty: 60 | Fill #0

## 2020-02-14 MED FILL — VIT D2 1.25 MG (50,000 UNIT: 1.25 MG | 34 days supply | Qty: 5 | Fill #0

## 2020-02-14 MED FILL — METHOCARBAMOL 500 MG TABS: 500 | 5 days supply | Qty: 30 | Fill #0

## 2020-02-14 MED FILL — FERROUS SULFATE 325 MG TAB: 325 (65 FE) | 30 days supply | Qty: 60 | Fill #0

## 2020-02-14 MED FILL — VITAMIN D3 2,000 UNIT TAB: 50 MCG | 15 days supply | Qty: 30 | Fill #0

## 2020-02-14 MED FILL — oxyCODONE HCL 10 MG TABS: 10 | 5 days supply | Qty: 30 | Fill #0

## 2020-02-14 MED FILL — ELIQUIS 2.5 MG TABLET: 2.5 | 21 days supply | Qty: 42 | Fill #0

## 2020-02-14 NOTE — Anesthesia Preprocedure Evaluation (Signed)
Anesthesia Evaluation  Patient identified by MRN, date of birth, ID band Patient awake    Reviewed: Allergy & Precautions, NPO status , Patient's Chart, lab work & pertinent test results  Airway Mallampati: II  TM Distance: >3 FB Neck ROM: Full    Dental  (+) Teeth Intact, Dental Advisory Given   Pulmonary    breath sounds clear to auscultation       Cardiovascular  Rhythm:Regular Rate:Normal     Neuro/Psych    GI/Hepatic   Endo/Other    Renal/GU      Musculoskeletal   Abdominal   Peds  Hematology   Anesthesia Other Findings   Reproductive/Obstetrics                             Anesthesia Physical Anesthesia Plan  ASA: III  Anesthesia Plan: General   Post-op Pain Management:  Regional for Post-op pain   Induction: Intravenous  PONV Risk Score and Plan: Ondansetron and Dexamethasone  Airway Management Planned: Oral ETT  Additional Equipment:   Intra-op Plan:   Post-operative Plan: Extubation in OR  Informed Consent: I have reviewed the patients History and Physical, chart, labs and discussed the procedure including the risks, benefits and alternatives for the proposed anesthesia with the patient or authorized representative who has indicated his/her understanding and acceptance.     Dental advisory given  Plan Discussed with: CRNA and Anesthesiologist  Anesthesia Plan Comments:         Anesthesia Quick Evaluation
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
## 2020-02-14 NOTE — TOC Progression Note (Signed)
Transition of Care [facility]) - Progression Note    Patient Details  Name: Robert Oliver MRN: [id number] Date of Birth: 1995/10/11  [facility] CM/SW Contact  Nadene Rubins Adria Devon, RN Phone Number: 02/14/2020, 12:12 PM  Clinical Narrative:        DME to be delivered to hospital room today . OP PT referral entered.   Patient in [facility] 9/17-21 to 02-21-20    Expected Discharge Plan and Services   In-house Referral: PCP / [facility] Discharge Planning Services: CM Consult   Living arrangements for the past 2 months: Single Family Home                 DME Arranged: 3-N-1, Community education officer wheelchair with seat cushion, Walker rolling (left platform walker) DME Agency: [employer] Date DME Agency Contacted: 02/13/20 Time DME Agency Contacted: 1331 Representative spoke with at DME Agency: [place]             Social Determinants of Health (SDOH) Interventions    Readmission Risk Interventions No flowsheet data found.

## 2020-02-14 NOTE — Progress Notes (Signed)
Orthopedic Tech Progress Note Patient Details:  Robert Oliver 09-16-1995 [id number] Was told to drop off, will be applied on follow up with MD Ortho Devices Type of Ortho Device: CAM walker Ortho Device/Splint Location: RLE Ortho Device/Splint Interventions: Other (comment)   Post Interventions Patient Tolerated: Other (comment) Instructions Provided: Other (comment)   Donald Pore 02/14/2020, 2:43 PM
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
## 2020-02-14 NOTE — Discharge Instructions (Addendum)
Orthopaedic Trauma Service Discharge Instructions   General Discharge Instructions  Orthopaedic Injuries:  Right ankle fracture treated with open reduction internal fixation using screws             Open left forearm fracture and distal radius fracture treated with irrigation debridement and open reduction internal fixation using plates and screws  WEIGHT BEARING STATUS: Nonweightbearing right leg.  Nonweightbearing left wrist.  Okay to use platform walker and put weight through left elbow to help mobilize  RANGE OF MOTION/ACTIVITY: Do not remove splints on left forearm and left ankle.  Move your fingers and toes as much as possible.  Ice and elevation to help with swelling control.  Activity as tolerated while maintaining weightbearing restrictions  Bone health: Labs show vitamin D deficiency please take vitamin D supplements that have been prescribed to you  Wound Care: Do not remove splints.  Do not get splint wet.  Call office with questions at [phone]  DVT/PE prophylaxis: Eliquis 2.5 mg 2 times a day for the next 3 weeks  Diet: as you were eating previously.  Can use over the counter stool softeners and bowel preparations, such as Miralax, to help with bowel movements.  Narcotics can be constipating.  Be sure to drink plenty of fluids  PAIN MEDICATION USE AND EXPECTATIONS  You have likely been given narcotic medications to help control your pain.  After a traumatic event that results in an fracture (broken bone) with or without surgery, it is ok to use narcotic pain medications to help control one's pain.  We understand that everyone responds to pain differently and each individual patient will be evaluated on a regular basis for the continued need for narcotic medications. Ideally, narcotic medication use should last no more than 6-8 weeks (coinciding with fracture healing).   As a patient it is your responsibility as well to monitor narcotic medication use and report the amount  and frequency you use these medications when you come to your office visit.   We would also advise that if you are using narcotic medications, you should take a dose prior to therapy to maximize you participation.  IF YOU ARE ON NARCOTIC MEDICATIONS IT IS NOT PERMISSIBLE TO OPERATE A MOTOR VEHICLE (MOTORCYCLE/CAR/TRUCK/MOPED) OR HEAVY MACHINERY DO NOT MIX NARCOTICS WITH OTHER CNS (CENTRAL NERVOUS SYSTEM) DEPRESSANTS SUCH AS ALCOHOL   STOP SMOKING OR USING NICOTINE PRODUCTS!!!!  As discussed nicotine severely impairs your body's ability to heal surgical and traumatic wounds but also impairs bone healing.  Wounds and bone heal by forming microscopic blood vessels (angiogenesis) and nicotine is a vasoconstrictor (essentially, shrinks blood vessels).  Therefore, if vasoconstriction occurs to these microscopic blood vessels they essentially disappear and are unable to deliver necessary nutrients to the healing tissue.  This is one modifiable factor that you can do to dramatically increase your chances of healing your injury.    (This means no smoking, no nicotine gum, patches, etc)  DO NOT USE NONSTEROIDAL ANTI-INFLAMMATORY DRUGS (NSAID'S)  Using products such as Advil (ibuprofen), Aleve (naproxen), Motrin (ibuprofen) for additional pain control during fracture healing can delay and/or prevent the healing response.  If you would like to take over the counter (OTC) medication, Tylenol (acetaminophen) is ok.  However, some narcotic medications that are given for pain control contain acetaminophen as well. Therefore, you should not exceed more than 4000 mg of tylenol in a day if you do not have liver disease.  Also note that there are may OTC medicines, such  as cold medicines and allergy medicines that my contain tylenol as well.  If you have any questions about medications and/or interactions please ask your doctor/PA or your pharmacist.      ICE AND ELEVATE INJURED/OPERATIVE EXTREMITY  Using ice and  elevating the injured extremity above your heart can help with swelling and pain control.  Icing in a pulsatile fashion, such as 20 minutes on and 20 minutes off, can be followed.    Do not place ice directly on skin. Make sure there is a barrier between to skin and the ice pack.    Using frozen items such as frozen peas works well as the conform nicely to the are that needs to be iced.  USE AN ACE WRAP OR TED HOSE FOR SWELLING CONTROL  In addition to icing and elevation, Ace wraps or TED hose are used to help limit and resolve swelling.  It is recommended to use Ace wraps or TED hose until you are informed to stop.    When using Ace Wraps start the wrapping distally (farthest away from the body) and wrap proximally (closer to the body)   Example: If you had surgery on your leg or thing and you do not have a splint on, start the ace wrap at the toes and work your way up to the thigh        If you had surgery on your upper extremity and do not have a splint on, start the ace wrap at your fingers and work your way up to the upper arm  IF YOU ARE IN A SPLINT OR CAST DO NOT REMOVE IT FOR ANY REASON   If your splint gets wet for any reason please contact the office immediately. You may shower in your splint or cast as long as you keep it dry.  This can be done by wrapping in a cast cover or garbage back (or similar)  Do Not stick any thing down your splint or cast such as pencils, money, or hangers to try and scratch yourself with.  If you feel itchy take benadryl as prescribed on the bottle for itching  IF YOU ARE IN A CAM BOOT (BLACK BOOT)  You may remove boot periodically. Perform daily dressing changes as noted below.  Wash the liner of the boot regularly and wear a sock when wearing the boot. It is recommended that you sleep in the boot until told otherwise    Call office for the following:  Temperature greater than 101F  Persistent nausea and vomiting  Severe uncontrolled pain  Redness,  tenderness, or signs of infection (pain, swelling, redness, odor or green/yellow discharge around the site)  Difficulty breathing, headache or visual disturbances  Hives  Persistent dizziness or light-headedness  Extreme fatigue  Any other questions or concerns you may have after discharge  In an emergency, call 911 or go to an Emergency Department at a nearby hospital  HELPFUL INFORMATION  ? If you had a block, it will wear off between 8-24 hrs postop typically.  This is period when your pain may go from nearly zero to the pain you would have had postop without the block.  This is an abrupt transition but nothing dangerous is happening.  You may take an extra dose of narcotic when this happens.  ? You should wean off your narcotic medicines as soon as you are able.  Most patients will be off or using minimal narcotics before their first postop appointment.   ? We  suggest you use the pain medication the first night prior to going to bed, in order to ease any pain when the anesthesia wears off. You should avoid taking pain medications on an empty stomach as it will make you nauseous.  ? Do not drink alcoholic beverages or take illicit drugs when taking pain medications.  ? In most states it is against the law to drive while you are in a splint or sling.  And certainly against the law to drive while taking narcotics.  ? You may return to work/school in the next couple of days when you feel up to it.   ? Pain medication may make you constipated.  Below are a few solutions to try in this order: - Decrease the amount of pain medication if you aren't having pain. - Drink lots of decaffeinated fluids. - Drink prune juice and/or each dried prunes  o If the first 3 don't work start with additional solutions - Take Colace - an over-the-counter stool softener - Take Senokot - an over-the-counter laxative - Take Miralax - a stronger over-the-counter laxative     CALL THE OFFICE WITH ANY  QUESTIONS OR CONCERNS: [phone]   VISIT OUR WEBSITE FOR ADDITIONAL INFORMATION: [email or web address]    Cast or Splint Care, Adult Casts and splints are supports that are worn to protect broken bones and other injuries. A cast or splint may hold a bone still and in the correct position while it heals. Casts and splints may also help to ease pain, swelling, and muscle spasms. How to care for your cast   Do not stick anything inside the cast to scratch your skin.  Check the skin around the cast every day. Tell your doctor about any concerns.  You may put lotion on dry skin around the edges of the cast. Do not put lotion on the skin under the cast.  Keep the cast clean.  If the cast is not waterproof: ? Do not let it get wet. ? Cover it with a watertight covering when you take a bath or a shower. How to care for your splint   Wear it as told by your doctor. Take it off only as told by your doctor.  Loosen the splint if your fingers or toes tingle, get numb, or turn cold and blue.  Keep the splint clean.  If the splint is not waterproof: ? Do not let it get wet. ? Cover it with a watertight covering when you take a bath or a shower. Follow these instructions at home: Bathing  Do not take baths or swim until your doctor says it is okay. Ask your doctor if you can take showers. You may only be allowed to take sponge baths for bathing.  If your cast or splint is not waterproof, cover it with a watertight covering when you take a bath or shower. Managing pain, stiffness, and swelling  Move your fingers or toes often to avoid stiffness and to lessen swelling.  Raise (elevate) the injured area above the level of your heart while sitting or lying down. Safety  Do not use the injured limb to support your body weight until your doctor says that it is okay.  Use crutches or other assistive devices as told by your doctor. General instructions  Do not put pressure on any part of  the cast or splint until it is fully hardened. This may take many hours.  Return to your normal activities as told by your doctor. Ask your doctor  what activities are safe for you.  Keep all follow-up visits as told by your doctor. This is important. Contact a doctor if:  Your cast or splint gets damaged.  The skin around the cast gets red or raw.  The skin under the cast is very itchy or painful.  Your cast or splint feels very uncomfortable.  Your cast or splint is too tight or too loose.  Your cast becomes wet or it starts to have a soft spot or area.  You get an object stuck under your cast. Get help right away if:  Your pain gets worse.  The injured area tingles, gets numb, or turns blue and cold.  The part of your body above or below the cast is swollen and it turns a different color (is discolored).  You cannot feel or move your fingers or toes.  There is fluid leaking through the cast.  You have very bad pain or pressure under the cast.  You have trouble breathing.  You have shortness of breath.  You have chest pain. This information is not intended to replace advice given to you by your health care provider. Make sure you discuss any questions you have with your health care provider. Document Revised: 09/05/2018 Document Reviewed: 05/06/2016 Elsevier Patient Education  2020 [employer].

## 2020-02-14 NOTE — Anesthesia Postprocedure Evaluation (Signed)
Anesthesia Post Note  Patient: Robert Oliver  Procedure(s) Performed: ORIF medial malleolus (Right Ankle)     Patient location during evaluation: PACU Anesthesia Type: General and Regional Level of consciousness: awake and alert Pain management: pain level controlled Vital Signs Assessment: post-procedure vital signs reviewed and stable Respiratory status: spontaneous breathing, nonlabored ventilation, respiratory function stable and patient connected to nasal cannula oxygen Cardiovascular status: blood pressure returned to baseline and stable Postop Assessment: no apparent nausea or vomiting Anesthetic complications: no   No complications documented.  Last Vitals:  Vitals:   02/14/20 0104 02/14/20 0444  BP: 126/76 123/85  Pulse: 76 84  Resp: 17 17  Temp: 37.4 C 37.2 C  SpO2: 97% 98%    Last Pain:  Vitals:   02/14/20 0606  TempSrc:   PainSc: 2                  Beola Vasallo COKER

## 2020-02-17 ENCOUNTER — Encounter (HOSPITAL_COMMUNITY): Payer: Self-pay | Admitting: Orthopedic Surgery

## 2020-02-24 MED FILL — METHOCARBAMOL 500 MG TABS: 500 | 10 days supply | Qty: 60 | Fill #0

## 2020-03-02 MED FILL — VIT D2 1.25 MG (50,000 UNIT: 1.25 MG | 28 days supply | Qty: 4 | Fill #0

## 2020-03-02 MED FILL — OXYCODONE-APAP 5-325MG: 5-325 | 7 days supply | Qty: 40 | Fill #0

## 2020-03-04 ENCOUNTER — Encounter (HOSPITAL_COMMUNITY): Payer: Self-pay | Admitting: Orthopedic Surgery

## 2020-03-16 ENCOUNTER — Ambulatory Visit: Payer: Medicaid Other | Admitting: Physical Therapy
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
## 2020-03-17 ENCOUNTER — Ambulatory Visit: Payer: No Typology Code available for payment source | Admitting: Physical Therapy

## 2020-03-19 ENCOUNTER — Ambulatory Visit: Payer: Medicaid Other | Attending: Physician Assistant | Admitting: Physical Therapy

## 2020-03-19 ENCOUNTER — Other Ambulatory Visit: Payer: Self-pay

## 2020-03-19 DIAGNOSIS — M25642 Stiffness of left hand, not elsewhere classified: Secondary | ICD-10-CM | POA: Insufficient documentation

## 2020-03-19 DIAGNOSIS — M79642 Pain in left hand: Secondary | ICD-10-CM | POA: Insufficient documentation

## 2020-03-19 DIAGNOSIS — M6281 Muscle weakness (generalized): Secondary | ICD-10-CM | POA: Insufficient documentation

## 2020-03-19 DIAGNOSIS — M25632 Stiffness of left wrist, not elsewhere classified: Secondary | ICD-10-CM | POA: Insufficient documentation

## 2020-03-19 DIAGNOSIS — M25532 Pain in left wrist: Secondary | ICD-10-CM | POA: Insufficient documentation

## 2020-03-19 DIAGNOSIS — R278 Other lack of coordination: Secondary | ICD-10-CM | POA: Insufficient documentation

## 2020-03-19 DIAGNOSIS — M79632 Pain in left forearm: Secondary | ICD-10-CM | POA: Diagnosis present

## 2020-03-19 DIAGNOSIS — R2689 Other abnormalities of gait and mobility: Secondary | ICD-10-CM | POA: Diagnosis not present

## 2020-03-19 DIAGNOSIS — M25671 Stiffness of right ankle, not elsewhere classified: Secondary | ICD-10-CM | POA: Diagnosis present
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
## 2020-03-24 NOTE — Addendum Note (Signed)
Addended by: Drake Leach on: 03/24/2020 09:13 AM   Modules accepted: Orders

## 2020-03-26 ENCOUNTER — Other Ambulatory Visit: Payer: Self-pay

## 2020-03-26 ENCOUNTER — Ambulatory Visit: Payer: Medicaid Other | Admitting: Occupational Therapy

## 2020-03-26 ENCOUNTER — Encounter: Payer: Self-pay | Admitting: Occupational Therapy

## 2020-03-26 DIAGNOSIS — R278 Other lack of coordination: Secondary | ICD-10-CM

## 2020-03-26 DIAGNOSIS — M25532 Pain in left wrist: Secondary | ICD-10-CM

## 2020-03-26 DIAGNOSIS — R2689 Other abnormalities of gait and mobility: Secondary | ICD-10-CM | POA: Diagnosis not present

## 2020-03-26 DIAGNOSIS — M25632 Stiffness of left wrist, not elsewhere classified: Secondary | ICD-10-CM

## 2020-03-26 DIAGNOSIS — M79642 Pain in left hand: Secondary | ICD-10-CM

## 2020-03-26 DIAGNOSIS — M79632 Pain in left forearm: Secondary | ICD-10-CM

## 2020-03-26 DIAGNOSIS — M25642 Stiffness of left hand, not elsewhere classified: Secondary | ICD-10-CM

## 2020-03-26 DIAGNOSIS — M6281 Muscle weakness (generalized): Secondary | ICD-10-CM
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
## 2020-03-26 NOTE — Patient Instructions (Addendum)
° °   AROM: Wrist Extension   .  With left_ palm down, bend wrist up. Repeat __15__ times per set.  Do __4-6__ sessions per day.    AROM: Wrist Flexion   With_left_ palm up, bend wrist up. Repeat __15__ times per set.  Do _4-6___ sessions per day.   AROM: Forearm Pronation / Supination   With _left _ arm in handshake position, slowly rotate palm down until stretch is felt. Relax. Then rotate palm up until stretch is felt. Repeat _15___ times per set. Do _4-6___ sessions per day.  Flexor Tendon Gliding (Active Full Fist)    Straighten all fingers, then make a fist, bending all joints. Repeat _10  times. Do _2-3 sessions per day.  Copyright  VHI. All rights reserved.   Copyright  VHI. All rights reserved.

## 2020-03-27 ENCOUNTER — Ambulatory Visit: Payer: Medicaid Other | Admitting: Physical Therapy

## 2020-03-27 ENCOUNTER — Encounter: Payer: Self-pay | Admitting: Physical Therapy

## 2020-03-27 DIAGNOSIS — R2689 Other abnormalities of gait and mobility: Secondary | ICD-10-CM | POA: Diagnosis not present

## 2020-03-27 DIAGNOSIS — M6281 Muscle weakness (generalized): Secondary | ICD-10-CM

## 2020-03-27 DIAGNOSIS — M25671 Stiffness of right ankle, not elsewhere classified: Secondary | ICD-10-CM
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
## 2020-03-27 NOTE — Patient Instructions (Signed)
Access Code: [facility] URL: [email or web address] Date: 03/27/2020 Prepared by: Sallyanne Kuster  Exercises Seated Toe Raise - 1 x daily - 5 x weekly - 1 sets - 10 reps Seated Ankle Plantarflexion with Resistance - 1 x daily - 5 x weekly - 1 sets - 10 reps Seated Ankle Eversion with Resistance - 1 x daily - 5 x weekly - 1 sets - 10 reps Ankle Inversion with Anchored Resistance at Table - 1 x daily - 5 x weekly - 1 sets - 10 reps Ankle Dorsiflexion with Resistance - 1 x daily - 5 x weekly - 1 sets - 10 reps Seated Calf Stretch with Strap - 1 x daily - 5 x weekly - 1 sets - 3 reps - 30 hold Single Leg Heel Raise with Unilateral Counter Support - 1 x daily - 5 x weekly - 1 sets - 10 reps - 3 hold Standing Single Leg Stance with Counter Support - 1 x daily - 5 x weekly - 1 sets - 3 reps - 30 hold

## 2020-04-01 ENCOUNTER — Encounter: Payer: Self-pay | Admitting: Occupational Therapy

## 2020-04-01 ENCOUNTER — Other Ambulatory Visit: Payer: Self-pay

## 2020-04-01 ENCOUNTER — Ambulatory Visit: Payer: Medicaid Other | Attending: Physician Assistant | Admitting: Occupational Therapy

## 2020-04-01 DIAGNOSIS — M79632 Pain in left forearm: Secondary | ICD-10-CM | POA: Diagnosis present

## 2020-04-01 DIAGNOSIS — M25671 Stiffness of right ankle, not elsewhere classified: Secondary | ICD-10-CM | POA: Diagnosis present

## 2020-04-01 DIAGNOSIS — M6281 Muscle weakness (generalized): Secondary | ICD-10-CM | POA: Insufficient documentation

## 2020-04-01 DIAGNOSIS — M79642 Pain in left hand: Secondary | ICD-10-CM | POA: Diagnosis present

## 2020-04-01 DIAGNOSIS — R2689 Other abnormalities of gait and mobility: Secondary | ICD-10-CM | POA: Diagnosis present

## 2020-04-01 DIAGNOSIS — M25632 Stiffness of left wrist, not elsewhere classified: Secondary | ICD-10-CM | POA: Insufficient documentation

## 2020-04-01 DIAGNOSIS — M25642 Stiffness of left hand, not elsewhere classified: Secondary | ICD-10-CM | POA: Diagnosis present

## 2020-04-01 DIAGNOSIS — R278 Other lack of coordination: Secondary | ICD-10-CM | POA: Diagnosis present

## 2020-04-01 DIAGNOSIS — M25532 Pain in left wrist: Secondary | ICD-10-CM | POA: Insufficient documentation
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
## 2020-04-01 NOTE — Patient Instructions (Signed)
AROM: Wrist Radial and Ulnar Deviation    With right thumb up, bend wrist up and then wrist down. Repeat _10_ times per set. Do 2-3 sets per session. Do _2-3_ sessions per day.  Copyright  VHI. All rights reserved.

## 2020-04-03 ENCOUNTER — Ambulatory Visit: Payer: Medicaid Other | Admitting: Physical Therapy

## 2020-04-03 ENCOUNTER — Encounter: Payer: Self-pay | Admitting: Occupational Therapy

## 2020-04-03 ENCOUNTER — Other Ambulatory Visit: Payer: Self-pay

## 2020-04-03 ENCOUNTER — Ambulatory Visit: Payer: Medicaid Other | Admitting: Occupational Therapy

## 2020-04-03 ENCOUNTER — Encounter: Payer: Self-pay | Admitting: Physical Therapy

## 2020-04-03 DIAGNOSIS — M25671 Stiffness of right ankle, not elsewhere classified: Secondary | ICD-10-CM

## 2020-04-03 DIAGNOSIS — M79632 Pain in left forearm: Secondary | ICD-10-CM

## 2020-04-03 DIAGNOSIS — M6281 Muscle weakness (generalized): Secondary | ICD-10-CM

## 2020-04-03 DIAGNOSIS — M25632 Stiffness of left wrist, not elsewhere classified: Secondary | ICD-10-CM

## 2020-04-03 DIAGNOSIS — R2689 Other abnormalities of gait and mobility: Secondary | ICD-10-CM

## 2020-04-03 DIAGNOSIS — R278 Other lack of coordination: Secondary | ICD-10-CM

## 2020-04-03 DIAGNOSIS — M79642 Pain in left hand: Secondary | ICD-10-CM

## 2020-04-03 DIAGNOSIS — M25532 Pain in left wrist: Secondary | ICD-10-CM

## 2020-04-03 NOTE — Patient Instructions (Signed)
Access Code: [facility] URL: [email or web address] Date: 04/03/2020 Prepared by: Sherlie Ban  Exercises Seated Toe Raise - 1 x daily - 5 x weekly - 1 sets - 10 reps Seated Ankle Plantarflexion with Resistance - 1 x daily - 5 x weekly - 1 sets - 10 reps Seated Ankle Eversion with Resistance - 1 x daily - 5 x weekly - 1 sets - 10 reps Ankle Inversion with Anchored Resistance at Table - 1 x daily - 5 x weekly - 1 sets - 10 reps Ankle Dorsiflexion with Resistance - 1 x daily - 5 x weekly - 1 sets - 10 reps Seated Calf Stretch with Strap - 1 x daily - 5 x weekly - 1 sets - 3 reps - 30 hold Single Leg Heel Raise with Unilateral Counter Support - 1 x daily - 5 x weekly - 1 sets - 10 reps - 3 hold Standing Single Leg Stance with Counter Support - 1 x daily - 5 x weekly - 1 sets - 3 reps - 30 hold Prone Quadriceps Stretch with Strap - 2 x daily - 5 x weekly - 3 sets - 30 hold

## 2020-04-03 NOTE — Patient Instructions (Signed)
Scar Tissue Massage    Place pad of fingertip on scar area. Apply steady downward pressure while moving in circular fashion. Use another fin-ger on top to assist. Repeat until entire scar has been covered. Repeat 5 times. Do _2-3_ sessions per day.  Copyright  VHI. All rights reserved.
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
## 2020-04-08 ENCOUNTER — Ambulatory Visit: Payer: Medicaid Other | Admitting: Occupational Therapy

## 2020-04-08 ENCOUNTER — Encounter: Payer: Self-pay | Admitting: Occupational Therapy

## 2020-04-08 ENCOUNTER — Ambulatory Visit: Payer: Medicaid Other | Admitting: Physical Therapy

## 2020-04-08 ENCOUNTER — Other Ambulatory Visit: Payer: Self-pay

## 2020-04-08 DIAGNOSIS — M6281 Muscle weakness (generalized): Secondary | ICD-10-CM

## 2020-04-08 DIAGNOSIS — M79632 Pain in left forearm: Secondary | ICD-10-CM

## 2020-04-08 DIAGNOSIS — R278 Other lack of coordination: Secondary | ICD-10-CM

## 2020-04-08 DIAGNOSIS — M25632 Stiffness of left wrist, not elsewhere classified: Secondary | ICD-10-CM

## 2020-04-08 DIAGNOSIS — M25642 Stiffness of left hand, not elsewhere classified: Secondary | ICD-10-CM

## 2020-04-08 DIAGNOSIS — M25532 Pain in left wrist: Secondary | ICD-10-CM

## 2020-04-08 DIAGNOSIS — M79642 Pain in left hand: Secondary | ICD-10-CM
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
## 2020-04-08 NOTE — Patient Instructions (Signed)
Opposition (Active) ° ° °Touch tip of thumb to nail tip of each finger in turn, making an "O" shape. °Repeat __10__ times. Do _4-6___ sessions per day. ° ° °MP Flexion (Active) ° ° °Bend thumb to touch base of little finger, keeping tip joint straight. °Repeat __10-15__ times. Do _4-6___ sessions per day. ° ° ° ° ° ° °IP Flexion (Active Blocked) ° ° °Brace thumb below tip joint. Bend joint as far as possible. °Repeat __10__ times. Do _4-6___ sessions per day. ° ° °Composite Extension (Active) ° ° °Bring thumb up and out in hitchhiker position.  °Repeat __10-15__ times. Do _4-6___ sessions per day. ° °  °

## 2020-04-10 ENCOUNTER — Ambulatory Visit: Payer: Medicaid Other | Admitting: Occupational Therapy

## 2020-04-10 ENCOUNTER — Other Ambulatory Visit: Payer: Self-pay

## 2020-04-10 ENCOUNTER — Encounter: Payer: Self-pay | Admitting: Occupational Therapy

## 2020-04-10 DIAGNOSIS — M79642 Pain in left hand: Secondary | ICD-10-CM

## 2020-04-10 DIAGNOSIS — M25632 Stiffness of left wrist, not elsewhere classified: Secondary | ICD-10-CM

## 2020-04-10 DIAGNOSIS — M79632 Pain in left forearm: Secondary | ICD-10-CM

## 2020-04-10 DIAGNOSIS — M6281 Muscle weakness (generalized): Secondary | ICD-10-CM | POA: Diagnosis not present

## 2020-04-10 DIAGNOSIS — R278 Other lack of coordination: Secondary | ICD-10-CM

## 2020-04-10 DIAGNOSIS — M25642 Stiffness of left hand, not elsewhere classified: Secondary | ICD-10-CM

## 2020-04-10 DIAGNOSIS — M25532 Pain in left wrist: Secondary | ICD-10-CM
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
## 2020-04-10 NOTE — Patient Instructions (Addendum)
Dr. Carola Frost,  I have been seeing Robert Oliver for occupational therapy at the neuro rehab outpatient clinic with [facility]. We have been working on AROM for his LUE wrist and hand. Please see the following range of motion measurements and advise on how to proceed and progress with his plan of care.   LUE Wrist  Flexion 30 Extension 20 Supination 40 Pronation 90 Radial Deviation 10 Ulnar Deviation 10  4th digit MCP 70 PIP 15-80 DIP 0-60  4th & 5th digit MCP 0- 55 PIP 35-75 DIP 20-55  Can we progress to PROM at the wrist and fingers?  Can we progress to light strengthening? Theraputty?  Thank you, Kallie Edward, MOT, OTR/L [phone]

## 2020-04-15 ENCOUNTER — Ambulatory Visit: Payer: Medicaid Other | Admitting: Occupational Therapy

## 2020-04-17 ENCOUNTER — Encounter: Payer: Self-pay | Admitting: Physical Therapy

## 2020-04-17 ENCOUNTER — Ambulatory Visit: Payer: Medicaid Other | Admitting: Occupational Therapy

## 2020-04-17 ENCOUNTER — Ambulatory Visit: Payer: Medicaid Other | Admitting: Physical Therapy

## 2020-04-17 ENCOUNTER — Encounter: Payer: Self-pay | Admitting: Occupational Therapy

## 2020-04-17 ENCOUNTER — Other Ambulatory Visit: Payer: Self-pay

## 2020-04-17 DIAGNOSIS — M25632 Stiffness of left wrist, not elsewhere classified: Secondary | ICD-10-CM

## 2020-04-17 DIAGNOSIS — R278 Other lack of coordination: Secondary | ICD-10-CM

## 2020-04-17 DIAGNOSIS — M6281 Muscle weakness (generalized): Secondary | ICD-10-CM | POA: Diagnosis not present

## 2020-04-17 DIAGNOSIS — M79632 Pain in left forearm: Secondary | ICD-10-CM

## 2020-04-17 DIAGNOSIS — M25532 Pain in left wrist: Secondary | ICD-10-CM

## 2020-04-17 DIAGNOSIS — R2689 Other abnormalities of gait and mobility: Secondary | ICD-10-CM

## 2020-04-17 DIAGNOSIS — M79642 Pain in left hand: Secondary | ICD-10-CM

## 2020-04-17 DIAGNOSIS — M25642 Stiffness of left hand, not elsewhere classified: Secondary | ICD-10-CM

## 2020-04-17 NOTE — Patient Instructions (Signed)
1. Grip Strengthening (Resistive Putty)   Squeeze putty using thumb and all fingers. Repeat _20___ times. Do __2__ sessions per day.   2. Roll putty into tube on table and pinch between each finger and thumb x 10 reps each. (can do ring and small finger together)     Copyright  VHI. All rights reserved.   
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
## 2020-04-20 ENCOUNTER — Ambulatory Visit: Payer: Medicaid Other | Admitting: Occupational Therapy

## 2020-04-29 ENCOUNTER — Ambulatory Visit: Payer: Medicaid Other | Admitting: Physical Therapy

## 2020-04-29 ENCOUNTER — Other Ambulatory Visit: Payer: Self-pay

## 2020-04-29 ENCOUNTER — Ambulatory Visit: Payer: Medicaid Other | Attending: Physician Assistant | Admitting: Occupational Therapy

## 2020-04-29 ENCOUNTER — Encounter: Payer: Self-pay | Admitting: Occupational Therapy

## 2020-04-29 ENCOUNTER — Encounter: Payer: Self-pay | Admitting: Physical Therapy

## 2020-04-29 DIAGNOSIS — M79642 Pain in left hand: Secondary | ICD-10-CM | POA: Diagnosis present

## 2020-04-29 DIAGNOSIS — M6281 Muscle weakness (generalized): Secondary | ICD-10-CM | POA: Diagnosis not present

## 2020-04-29 DIAGNOSIS — M25642 Stiffness of left hand, not elsewhere classified: Secondary | ICD-10-CM | POA: Diagnosis present

## 2020-04-29 DIAGNOSIS — M25632 Stiffness of left wrist, not elsewhere classified: Secondary | ICD-10-CM | POA: Diagnosis present

## 2020-04-29 DIAGNOSIS — M79632 Pain in left forearm: Secondary | ICD-10-CM | POA: Insufficient documentation

## 2020-04-29 DIAGNOSIS — M25532 Pain in left wrist: Secondary | ICD-10-CM | POA: Insufficient documentation

## 2020-04-29 DIAGNOSIS — R2689 Other abnormalities of gait and mobility: Secondary | ICD-10-CM | POA: Diagnosis present

## 2020-04-29 DIAGNOSIS — R278 Other lack of coordination: Secondary | ICD-10-CM | POA: Diagnosis present
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
## 2020-04-29 NOTE — Patient Instructions (Signed)
PROM: Finger MP Joints   Passively bend ________ finger of hand at big knuckle until stretch is felt. Hold _10___ seconds. Relax. Straighten finger as far as possible. Repeat __5__ times per set.  Do __4-6__ sessions per day.   PIP Flexion (Passive)   Use other hand to bend the middle joint of ______ finger down as far as possible. Hold _10___ seconds. Repeat __5__ times. Do _4-6___ sessions per day.    PROM: Finger DIP Joints   Passively bend ________ finger(s) of  hand at tip joint until stretch is felt. Hold __10__ seconds. Relax. Straighten finger as far as possible. Repeat _5___ times per set.  Do __4-6__ sessions per day.  Copyright  VHI. All rights reserved.  PROM: Wrist Flexion / Extension   Grasp  hand and slowly bend wrist until stretch is felt. Relax. Then stretch as far as possible in opposite direction. Be sure to keep elbow bent.  Hold __10__ sec. each way Repeat _5___ times per set.    Do _4-6___ sessions per day.  Pronation (Passive)   Keep elbow bent at right angle and held firmly to side. Use other hand to turn forearm until palm faces downward. Hold _10___ seconds. Repeat __5__ times. Do _4-6___ sessions per day.  Supination (Passive)   Keep elbow bent at right angle and held firmly at side. Use other hand to turn forearm until palm faces upward. Hold __10__ seconds. Repeat __5__ times. Do _4-6___ sessions per day.  Copyright  VHI. All rights reserved.

## 2020-05-01 ENCOUNTER — Ambulatory Visit: Payer: Medicaid Other | Admitting: Occupational Therapy
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
## 2020-05-06 ENCOUNTER — Ambulatory Visit: Payer: Medicaid Other | Admitting: Occupational Therapy

## 2020-05-06 ENCOUNTER — Encounter: Payer: Self-pay | Admitting: Occupational Therapy

## 2020-05-06 ENCOUNTER — Ambulatory Visit: Payer: Medicaid Other | Admitting: Physical Therapy

## 2020-05-06 ENCOUNTER — Encounter: Payer: Self-pay | Admitting: Physical Therapy

## 2020-05-06 ENCOUNTER — Other Ambulatory Visit: Payer: Self-pay

## 2020-05-06 DIAGNOSIS — R278 Other lack of coordination: Secondary | ICD-10-CM

## 2020-05-06 DIAGNOSIS — R2689 Other abnormalities of gait and mobility: Secondary | ICD-10-CM

## 2020-05-06 DIAGNOSIS — M6281 Muscle weakness (generalized): Secondary | ICD-10-CM

## 2020-05-06 DIAGNOSIS — M25532 Pain in left wrist: Secondary | ICD-10-CM

## 2020-05-06 DIAGNOSIS — M25642 Stiffness of left hand, not elsewhere classified: Secondary | ICD-10-CM

## 2020-05-06 DIAGNOSIS — M79632 Pain in left forearm: Secondary | ICD-10-CM

## 2020-05-06 DIAGNOSIS — M25632 Stiffness of left wrist, not elsewhere classified: Secondary | ICD-10-CM

## 2020-05-06 DIAGNOSIS — M79642 Pain in left hand: Secondary | ICD-10-CM

## 2020-05-06 NOTE — Patient Instructions (Signed)
Access Code: [facility] URL: [email or web address] Date: 05/06/2020 Prepared by: Sherlie Ban  Exercises Ankle Inversion with Anchored Resistance at Table - 1 x daily - 5 x weekly - 1 sets - 10 reps Ankle Dorsiflexion with Resistance - 1 x daily - 5 x weekly - 1 sets - 10 reps Seated Calf Stretch with Strap - 1 x daily - 5 x weekly - 1 sets - 3 reps - 30 hold Single Leg Heel Raise with Unilateral Counter Support - 1 x daily - 5 x weekly - 1 sets - 10 reps - 3 hold Standing Single Leg Stance with Counter Support - 1 x daily - 5 x weekly - 1 sets - 3 reps - 30 hold Prone Quadriceps Stretch with Strap - 2 x daily - 5 x weekly - 3 sets - 30 hold Mini Lunge - 1 x daily - 5 x weekly - 2 sets - 10 reps Side Lunge with Counter Support - 1 x daily - 5 x weekly - 2 sets - 10 reps Single Leg Balance with Eyes Closed - 2 x daily - 7 x weekly - 3 sets - 10 hold Standing Dorsiflexion Self-Mobilization on Step - 1 x daily - 7 x weekly - 3 sets - 15 hold
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
## 2020-05-06 NOTE — Patient Instructions (Signed)
Local Driver Evaluation Programs: ° °Comprehensive Evaluation: includes clinical and in vehicle behind the wheel testing by OCCUPATIONAL THERAPIST. Programs have varying levels of adaptive controls available for trial.  ° °[facility] °[address] °[place], [place]  [address] °[phone] or [phone] °http://www.driver-rehab.com °Evaluator:  Cyndee Crompton, OT/CDRS/CDI/SCDCM/Low Vision Certification ° °[facility] °[address] °[place], [place] [address] °[phone] °https://www.[email or web address].org/home/services/rehabilitation.aspx °Evaluators:  Shannon Sheek, OT and Jill Tucker, OT ° °[facility] [place] [facility] (ONLY SERVES VETERANS!!) °[facility] °[address] °[place], [place]  [address] °[phone] [phone] °http://www.[place].va.gov/services/Physical_Medicine_Rehabilitation_Services.asp °Evaluators:  Eric Andrews, KT; Heidi Harris, KT;  Gary Whitaker, KT (KT=kiniesotherapist) ° ° °Clinical evaluations only:  Includes clinical testing, refers to other programs or local certified driving instructor for behind the wheel testing. ° °[facility] (outpatient Rehab) °[facility] °[address] °[place], [place] [address] °[phone] for scheduling °http://[email or web address]/Outpatient-Rehabilitation/Neurorehabilitation-Therapy.htm °Evaluators:  Kelly Lambeth, OT; Kate Phillips, OT ° °Other area clinical evaluators available upon request including [facility], [facility] and [facility]. ° ° °    Resource List °What is a Driver Evaluation: °Your Road Ahead - A Guide to Comprehensive Driving Evaluations °http://www.thehartford.com/resources/mature-market-excellence/publications-on-aging ° °Association for Driver Rehabilitation Services - Disability and Driving Fact Sheets °http://www.aded.net/?page=510 ° °Driving after a Brain  Injury: °[employer] °http://www.biausa.org/tbims-abstracts/if-there-is-an-effective-way-to-determine-if-someone-is-ready-to-drive-after-tbi?A=SearchResult&SearchID=9495675&ObjectID=2758842&ObjectType=35 ° °Driving with Adaptive Equipment: °[facility] Process °http://www.driver-rehab.com/adaptive-equipment ° °[employer] Mob[employer] °http://www.nmeda.com/ ° ° ° ° ° ° °  °
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
## 2020-05-08 ENCOUNTER — Ambulatory Visit: Payer: Medicaid Other | Admitting: Occupational Therapy

## 2020-05-13 ENCOUNTER — Ambulatory Visit: Payer: Medicaid Other | Admitting: Physical Therapy

## 2020-05-18 ENCOUNTER — Other Ambulatory Visit: Payer: Self-pay | Admitting: Orthopedic Surgery

## 2020-05-18 DIAGNOSIS — S52352E Displaced comminuted fracture of shaft of radius, left arm, subsequent encounter for open fracture type I or II with routine healing: Secondary | ICD-10-CM

## 2020-06-08 ENCOUNTER — Inpatient Hospital Stay: Admission: RE | Admit: 2020-06-08 | Payer: Medicaid Other | Source: Ambulatory Visit

## 2020-06-08 ENCOUNTER — Ambulatory Visit: Payer: Medicaid Other | Attending: Physician Assistant | Admitting: Occupational Therapy

## 2020-06-08 ENCOUNTER — Other Ambulatory Visit: Payer: Self-pay

## 2020-06-08 DIAGNOSIS — M25632 Stiffness of left wrist, not elsewhere classified: Secondary | ICD-10-CM | POA: Diagnosis present

## 2020-06-08 DIAGNOSIS — R278 Other lack of coordination: Secondary | ICD-10-CM | POA: Diagnosis present

## 2020-06-08 DIAGNOSIS — M25532 Pain in left wrist: Secondary | ICD-10-CM | POA: Diagnosis present

## 2020-06-08 DIAGNOSIS — M79632 Pain in left forearm: Secondary | ICD-10-CM

## 2020-06-08 DIAGNOSIS — M25642 Stiffness of left hand, not elsewhere classified: Secondary | ICD-10-CM | POA: Diagnosis present

## 2020-06-08 DIAGNOSIS — M6281 Muscle weakness (generalized): Secondary | ICD-10-CM | POA: Diagnosis present

## 2020-06-08 DIAGNOSIS — M79642 Pain in left hand: Secondary | ICD-10-CM
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
## 2020-06-15 ENCOUNTER — Ambulatory Visit: Payer: Medicaid Other | Admitting: Occupational Therapy

## 2020-06-17 ENCOUNTER — Ambulatory Visit: Payer: Medicaid Other | Admitting: Occupational Therapy

## 2020-06-22 ENCOUNTER — Ambulatory Visit: Payer: Medicaid Other | Admitting: Occupational Therapy

## 2020-06-22 ENCOUNTER — Other Ambulatory Visit: Payer: Medicaid Other

## 2020-06-22 DIAGNOSIS — Z20822 Contact with and (suspected) exposure to covid-19: Secondary | ICD-10-CM

## 2020-06-23 LAB — NOVEL CORONAVIRUS, NAA: SARS-CoV-2, NAA: NOT DETECTED

## 2020-06-23 LAB — SARS-COV-2, NAA 2 DAY TAT

## 2020-06-26 ENCOUNTER — Other Ambulatory Visit: Payer: Medicaid Other

## 2020-06-29 ENCOUNTER — Ambulatory Visit: Payer: Medicaid Other | Admitting: Occupational Therapy

## 2020-06-30 ENCOUNTER — Ambulatory Visit: Payer: No Typology Code available for payment source | Attending: Physician Assistant | Admitting: Occupational Therapy

## 2020-07-03 ENCOUNTER — Inpatient Hospital Stay: Admission: RE | Admit: 2020-07-03 | Payer: Medicaid Other | Source: Ambulatory Visit

## 2020-07-27 ENCOUNTER — Ambulatory Visit: Payer: No Typology Code available for payment source | Admitting: Occupational Therapy

## 2020-08-03 ENCOUNTER — Ambulatory Visit: Payer: No Typology Code available for payment source | Attending: Physician Assistant | Admitting: Occupational Therapy

## 2020-08-10 ENCOUNTER — Ambulatory Visit: Payer: No Typology Code available for payment source | Admitting: Occupational Therapy

## 2021-03-29 ENCOUNTER — Encounter (HOSPITAL_BASED_OUTPATIENT_CLINIC_OR_DEPARTMENT_OTHER): Payer: Self-pay | Admitting: Emergency Medicine

## 2021-03-29 ENCOUNTER — Emergency Department (HOSPITAL_BASED_OUTPATIENT_CLINIC_OR_DEPARTMENT_OTHER): Payer: Medicaid Other

## 2021-03-29 ENCOUNTER — Emergency Department (HOSPITAL_BASED_OUTPATIENT_CLINIC_OR_DEPARTMENT_OTHER)
Admission: EM | Admit: 2021-03-29 | Discharge: 2021-03-29 | Disposition: A | Discharge status: Home / Self Care | Payer: Medicaid Other | Attending: Emergency Medicine | Admitting: Emergency Medicine

## 2021-03-29 ENCOUNTER — Other Ambulatory Visit: Payer: Self-pay

## 2021-03-29 DIAGNOSIS — W19XXXA Unspecified fall, initial encounter: Secondary | ICD-10-CM | POA: Diagnosis not present

## 2021-03-29 DIAGNOSIS — G44319 Acute post-traumatic headache, not intractable: Secondary | ICD-10-CM | POA: Insufficient documentation

## 2021-03-29 DIAGNOSIS — Z7901 Long term (current) use of anticoagulants: Secondary | ICD-10-CM | POA: Insufficient documentation
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
## 2021-03-29 NOTE — ED Notes (Signed)
Pt discharged to home. Discharge instructions have been discussed with patient and/or family members. Pt verbally acknowledges understanding d/c instructions, and endorses comprehension to checkout at registration before leaving.  °

## 2021-03-29 NOTE — ED Triage Notes (Addendum)
Pt arrives to ED with c/o of fall. 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 This occurred x5 days ago on 10/26. He denies LOC after the event. He reports intermittent headaches since event.

## 2021-05-13 ENCOUNTER — Ambulatory Visit
Admission: EM | Admit: 2021-05-13 | Discharge: 2021-05-13 | Disposition: A | Discharge status: Home / Self Care | Payer: Medicaid Other | Attending: Physician Assistant | Admitting: Physician Assistant

## 2021-05-13 DIAGNOSIS — Z113 Encounter for screening for infections with a predominantly sexual mode of transmission: Secondary | ICD-10-CM

## 2021-05-13 NOTE — ED Triage Notes (Signed)
Pt c/o wife having multiple BV(+) results. States he would like to be evaluated to make sure he is not the cause.
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
## 2021-05-17 LAB — CYTOLOGY, (ORAL, ANAL, URETHRAL) ANCILLARY ONLY

## 2021-05-21 ENCOUNTER — Ambulatory Visit
Admission: EM | Admit: 2021-05-21 | Discharge: 2021-05-21 | Disposition: A | Discharge status: Home / Self Care | Payer: Medicaid Other | Attending: Physician Assistant | Admitting: Physician Assistant

## 2021-05-21 ENCOUNTER — Other Ambulatory Visit: Payer: Self-pay

## 2021-05-21 DIAGNOSIS — Z113 Encounter for screening for infections with a predominantly sexual mode of transmission: Secondary | ICD-10-CM | POA: Insufficient documentation

## 2021-05-21 NOTE — ED Notes (Signed)
Here for re-swab/urethral specimen collection only

## 2021-05-26 ENCOUNTER — Telehealth: Payer: Self-pay | Admitting: Emergency Medicine

## 2021-05-26 NOTE — Telephone Encounter (Signed)
Lab called, stated the re-swab from the 23rd cannot be run without a new order. Placing new cytology order at this time

## 2021-05-27 LAB — CYTOLOGY, (ORAL, ANAL, URETHRAL) ANCILLARY ONLY
Chlamydia: NEGATIVE
Comment: NEGATIVE
Comment: NORMAL
Neisseria Gonorrhea: NEGATIVE

## 2022-06-04 ENCOUNTER — Other Ambulatory Visit: Payer: Self-pay

## 2022-06-04 ENCOUNTER — Emergency Department (HOSPITAL_COMMUNITY): Payer: Medicaid Other

## 2022-06-04 ENCOUNTER — Emergency Department (HOSPITAL_COMMUNITY)
Admission: EM | Admit: 2022-06-04 | Discharge: 2022-06-04 | Disposition: A | Discharge status: Home / Self Care | Payer: Medicaid Other | Attending: Student | Admitting: Student

## 2022-06-04 DIAGNOSIS — N50812 Left testicular pain: Secondary | ICD-10-CM | POA: Insufficient documentation

## 2022-06-04 LAB — BASIC METABOLIC PANEL
Anion gap: 10 (ref 5–15)
BUN: 6 mg/dL (ref 6–20)
CO2: 24 mmol/L (ref 22–32)
Calcium: 9.3 mg/dL (ref 8.9–10.3)
Chloride: 101 mmol/L (ref 98–111)
Creatinine, Ser: 0.96 mg/dL (ref 0.61–1.24)
GFR, Estimated: >60 mL/min (ref >60)
Glucose, Bld: 81 mg/dL (ref 70–99)
Potassium: 3.4 mmol/L — ABNORMAL LOW (ref 3.5–5.1)
Sodium: 135 mmol/L (ref 135–145)

## 2022-06-04 LAB — URINALYSIS, ROUTINE W REFLEX MICROSCOPIC
Bacteria, UA: NONE SEEN
Bilirubin Urine: NEGATIVE
Glucose, UA: NEGATIVE mg/dL
Ketones, ur: NEGATIVE mg/dL
Leukocytes,Ua: NEGATIVE
Nitrite: NEGATIVE
Protein, ur: NEGATIVE mg/dL
Specific Gravity, Urine: 1.004 — ABNORMAL LOW (ref 1.005–1.030)
pH: 6 (ref 5.0–8.0)

## 2022-06-04 LAB — CBC WITH DIFFERENTIAL/PLATELET
Abs Immature Granulocytes: 0.05 10*3/uL (ref 0.00–0.07)
Basophils Absolute: 0 10*3/uL (ref 0.0–0.1)
Basophils Relative: 0 %
Eosinophils Absolute: 0 10*3/uL (ref 0.0–0.5)
Eosinophils Relative: 0 %
HCT: 42.2 % (ref 39.0–52.0)
Hemoglobin: 14.7 g/dL (ref 13.0–17.0)
Immature Granulocytes: 1 %
Lymphocytes Relative: 5 %
Lymphs Abs: 0.4 10*3/uL — ABNORMAL LOW (ref 0.7–4.0)
MCH: 32.4 pg (ref 26.0–34.0)
MCHC: 34.8 g/dL (ref 30.0–36.0)
MCV: 93 fL (ref 80.0–100.0)
Monocytes Absolute: 1.2 10*3/uL — ABNORMAL HIGH (ref 0.1–1.0)
Monocytes Relative: 16 %
Neutro Abs: 6 10*3/uL (ref 1.7–7.7)
Neutrophils Relative %: 78 %
Platelets: 171 10*3/uL (ref 150–400)
RBC: 4.54 MIL/uL (ref 4.22–5.81)
RDW: 12.3 % (ref 11.5–15.5)
WBC: 7.8 10*3/uL (ref 4.0–10.5)
nRBC: 0 % (ref 0.0–0.2)

## 2022-06-04 MED ORDER — LIDOCAINE HCL (PF) 1 % IJ SOLN
1.0000 mL | Freq: Once | INTRAMUSCULAR | Status: AC
  Administered 2022-06-04: 1.2 mL
  Filled 2022-06-04: qty 5

## 2022-06-04 MED ORDER — DOXYCYCLINE HYCLATE 100 MG PO CAPS: 100.0000 mg | ORAL_CAPSULE | Freq: Two times a day (BID) | ORAL | 0 refills | Status: AC

## 2022-06-04 MED ORDER — CEFTRIAXONE SODIUM 500 MG IJ SOLR
500.0000 mg | Freq: Once | INTRAMUSCULAR | Status: AC
  Administered 2022-06-04: 500 mg via INTRAMUSCULAR
  Filled 2022-06-04: qty 500

## 2022-06-04 NOTE — ED Provider Triage Note (Signed)
Emergency Medicine Provider Triage Evaluation Note  Robert Oliver , a 27 y.o. male  was evaluated in triage.  Pt complains of left-sided testicular pain started this morning, states it is a cramp-like sensation, only gets relief with elevation, no penile discharge no urinary symptoms, patient states that he has had a torsion in the past from a traumatic injury.  He states he is concerned this again no traumatic injury.  Review of Systems  Positive: Testicular pain Negative: Dysuria hematuria  Physical Exam  BP (!) 144/83 (BP Location: Right Arm)   Pulse 98   Temp 99.9 F (37.7 C) (Oral)   Resp 16   SpO2 97%  Gen:   Awake, no distress   Resp:  Normal effort  MSK:   Moves extremities without difficulty  Other:  With chaperone presents general exam was performed, he is uncircumcised penis with 2 descended testes, both were equal symmetrical bilaterally no Bell Clapper deformity, he was notably tender on the left testicle, cremaster reflex intact, no penile discharge present  Medical Decision Making  Medically screening exam initiated at 1:28 AM.  Appropriate orders placed.  Robert Righter Gist was informed that the remainder of the evaluation will be completed by another provider, this initial triage assessment does not replace that evaluation, and the importance of remaining in the ED until their evaluation is complete.  Lab work imaging been ordered will need further workup.   Marcello Fennel, PA-C 06/04/22 0129

## 2022-06-04 NOTE — ED Triage Notes (Signed)
Patient reports left testicular pain this evening .

## 2022-06-04 NOTE — Discharge Instructions (Signed)
Your ultrasound was reassuring, due to your recent sexual intercourse I am concerned for possible STI, I have given a shot of ceftriaxone this to cover for gonorrhea, I am discharging you home on doxycycline this will cover you for chlamydia, your gonorrhea chlamydia test is pending you should see the results in 2 days time if your test is negative for chlamydia you can discontinue the doxycycline.  In regards to your testicle pain I recommend scrotal support where underwear that is tightly fitting and elevating your testicles.  If your symptoms continue after a week's time please follow-up with urology  Come back to the emergency department if you develop chest pain, shortness of breath, severe abdominal pain, uncontrolled nausea, vomiting, diarrhea.
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
## 2022-10-08 ENCOUNTER — Ambulatory Visit (HOSPITAL_COMMUNITY): Payer: Self-pay

## 2023-02-02 IMAGING — CT CT HEAD W/O CM
4 series · 16 of 47 positions shown, 18 images · non-contrast
Comparison: Head 02/09/2020

CLINICAL DATA: Headache new or worsening.  Status post fall

EXAM:
CT HEAD WITHOUT CONTRAST
TECHNIQUE: Contiguous axial images were obtained from the base of the skull
through the vertex without intravenous contrast.

[Series 2: head wo · axial · 0.41mm/px · z∈[+1232,+1347]mm · 7 of 31 slices shown, 9 images]
[im 4/31  brain]
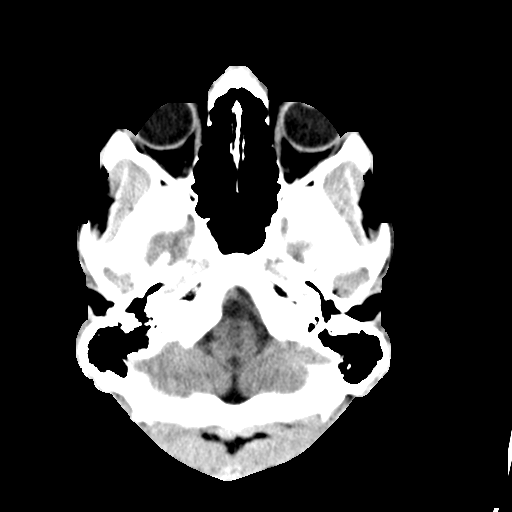
[im 4/31  bone]
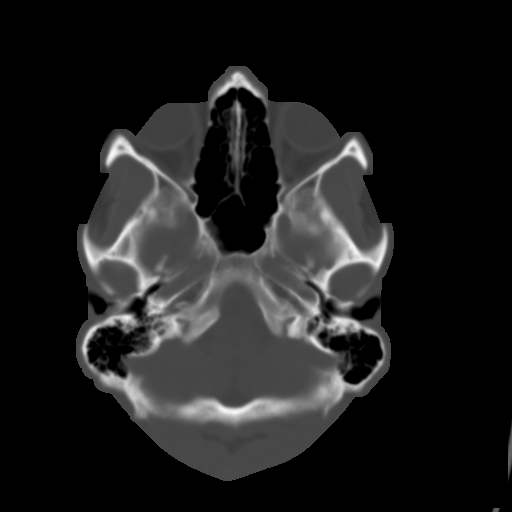
[im 8/31  brain]
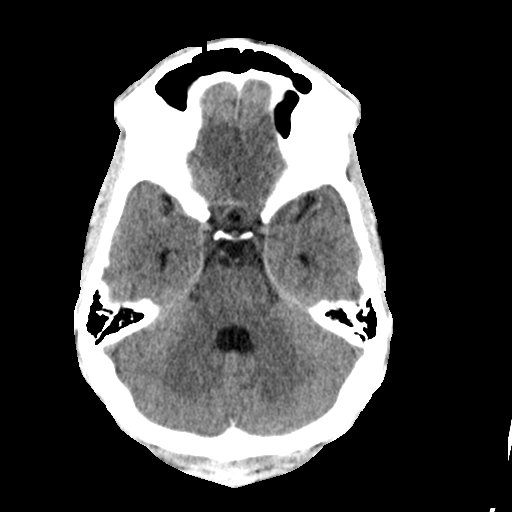
[im 12/31  brain]
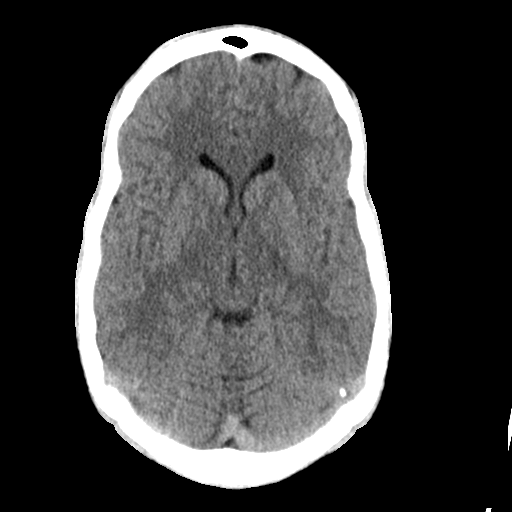
[im 16/31  brain]
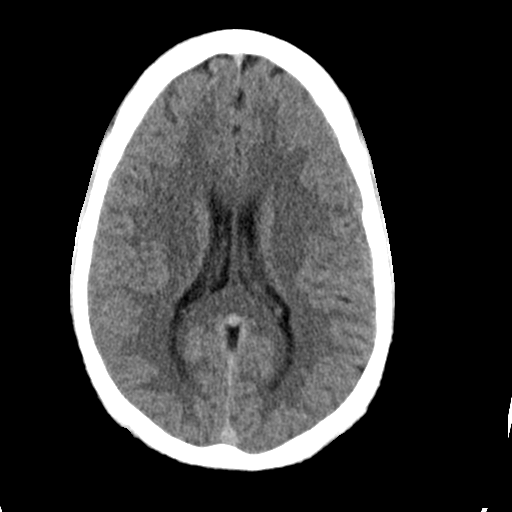
[im 19/31  brain]
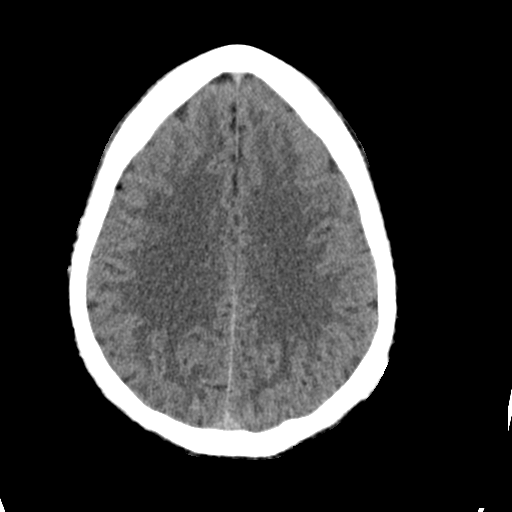
[im 19/31  bone]
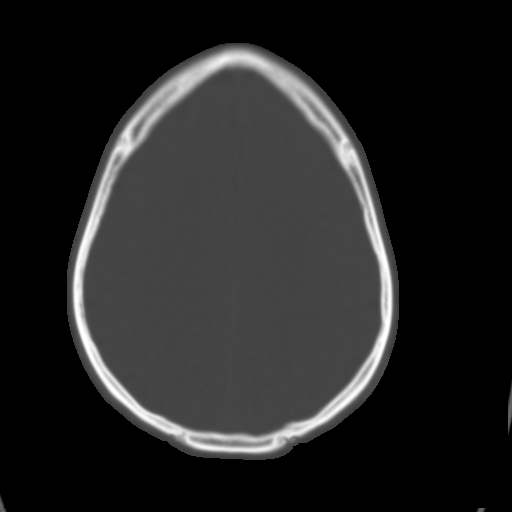
[im 23/31  brain]
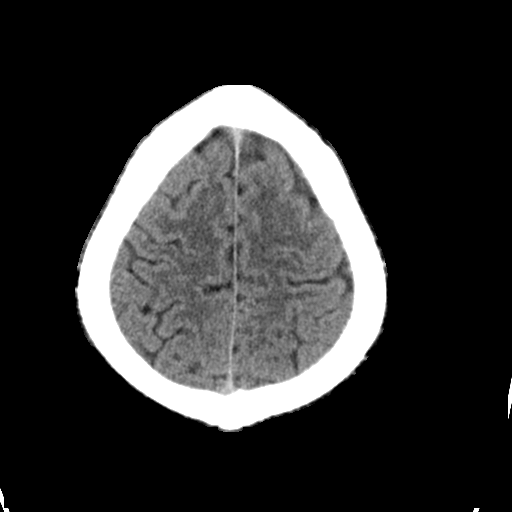
[im 27/31  brain]
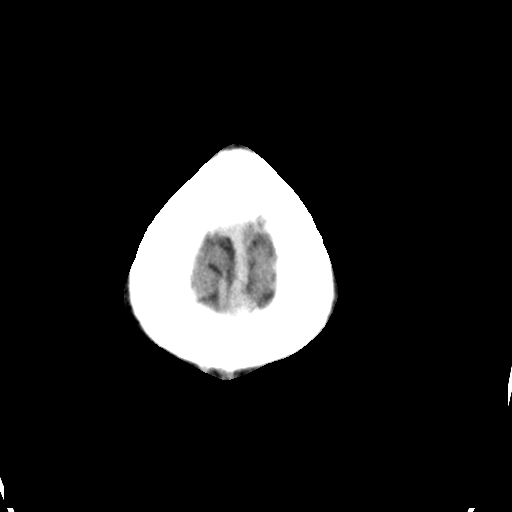

[Series 3: head bone · axial · 0.41mm/px · z∈[+1231,+1261]mm · 3 of 76 slices shown]
[im 8/76  bone]
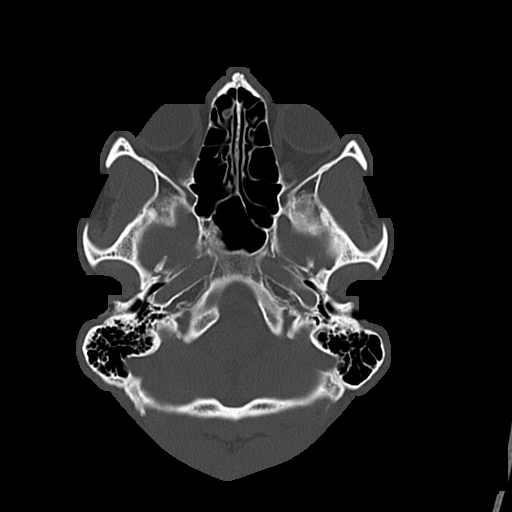
[im 16/76  bone]
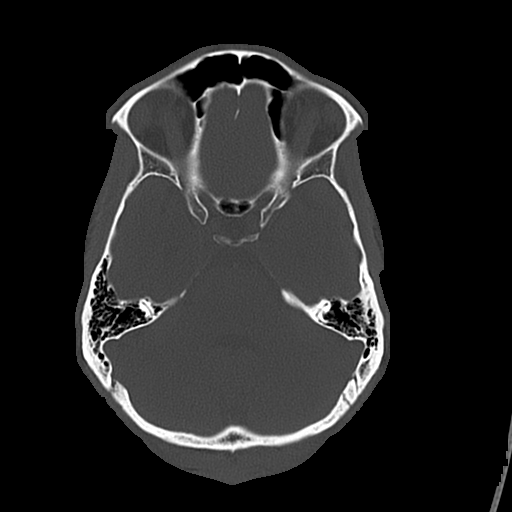
[im 23/76  bone]
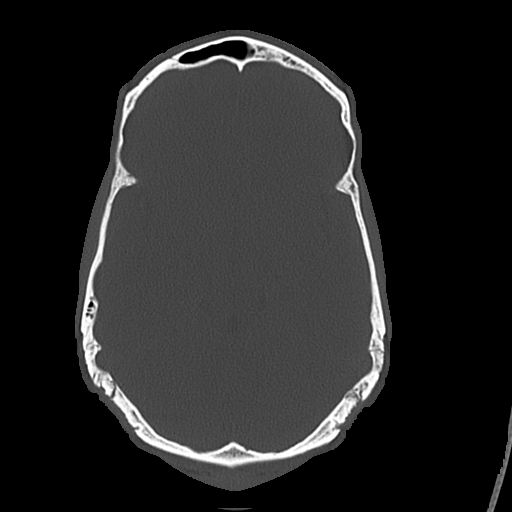

[Series 4: coronal soft · coronal · 0.30mm/px · 3 of 69 slices shown]
[im 24/69  brain]
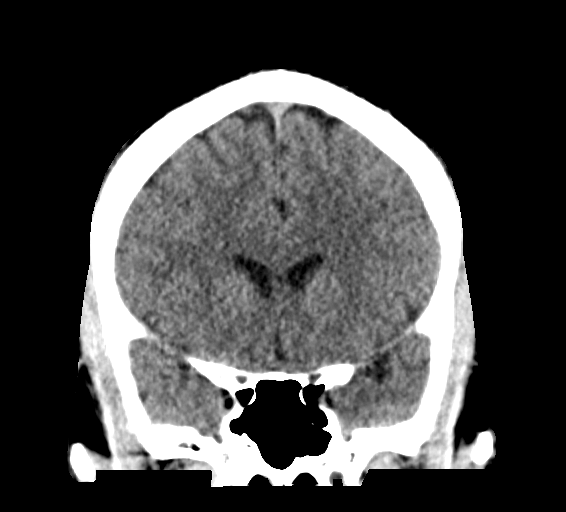
[im 31/69  brain]
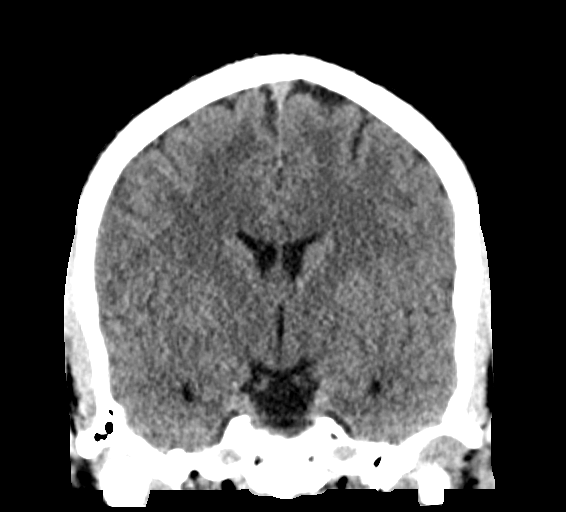
[im 38/69  brain]
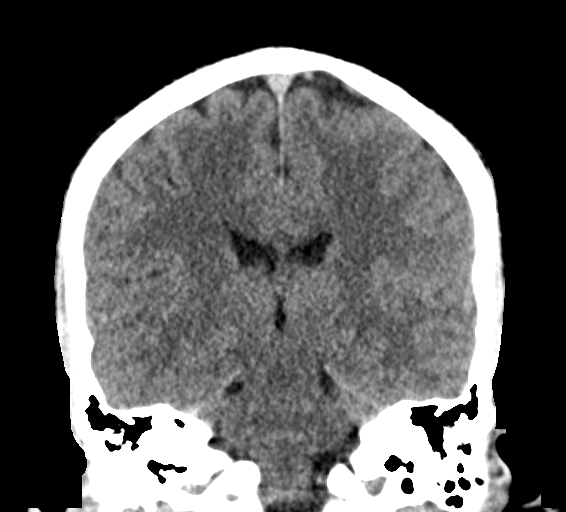

[Series 5: sagittal soft · sagittal · 0.30mm/px · 3 of 57 slices shown]
[im 19/57  brain]
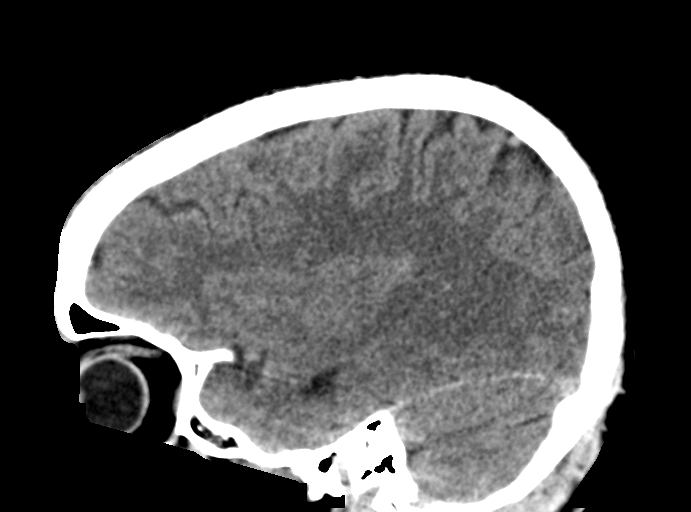
[im 29/57  brain]
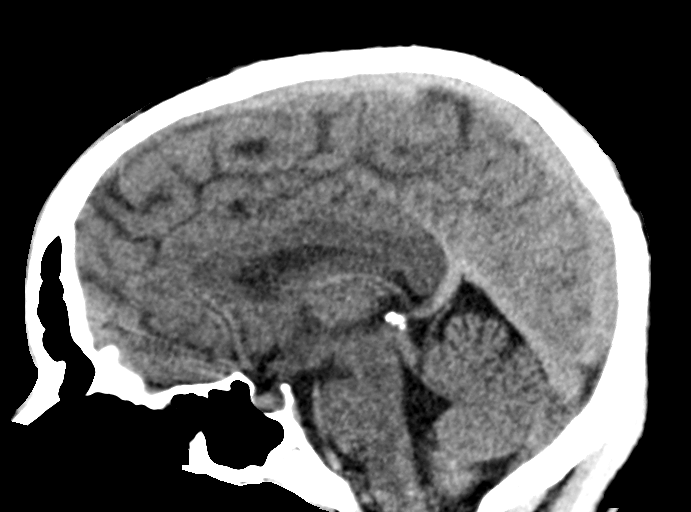
[im 38/57  brain]
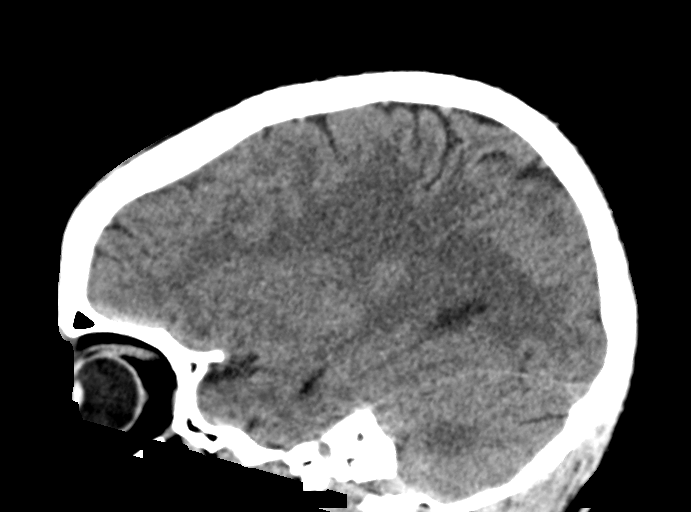

[16 of 47 positions shown; findings below may reference images not displayed]

FINDINGS: Brain:

No evidence of large-territorial acute infarction. No parenchymal
hemorrhage. No mass lesion. No extra-axial collection.

No mass effect or midline shift. No hydrocephalus. Basilar cisterns
are patent.

Vascular: No hyperdense vessel.

Skull: No acute fracture or focal lesion.

Sinuses/Orbits: Paranasal sinuses and mastoid air cells are clear.
The orbits are unremarkable.

Other: None.
IMPRESSION: No acute intracranial abnormality.

## 2023-10-22 ENCOUNTER — Emergency Department (HOSPITAL_COMMUNITY)
Admission: EM | Admit: 2023-10-22 | Discharge: 2023-10-22 | Disposition: A | Discharge status: Home / Self Care | Attending: Emergency Medicine | Admitting: Emergency Medicine

## 2023-10-22 ENCOUNTER — Emergency Department (HOSPITAL_COMMUNITY)

## 2023-10-22 ENCOUNTER — Other Ambulatory Visit: Payer: Self-pay

## 2023-10-22 ENCOUNTER — Encounter (HOSPITAL_COMMUNITY): Payer: Self-pay | Admitting: Emergency Medicine

## 2023-10-22 DIAGNOSIS — N50811 Right testicular pain: Secondary | ICD-10-CM | POA: Diagnosis present

## 2023-10-22 DIAGNOSIS — R300 Dysuria: Secondary | ICD-10-CM | POA: Insufficient documentation

## 2023-10-22 LAB — URINALYSIS, ROUTINE W REFLEX MICROSCOPIC
Bacteria, UA: NONE SEEN
Bilirubin Urine: NEGATIVE
Glucose, UA: NEGATIVE mg/dL
Ketones, ur: NEGATIVE mg/dL
Leukocytes,Ua: NEGATIVE
Nitrite: NEGATIVE
Protein, ur: NEGATIVE mg/dL
Specific Gravity, Urine: 1.004 — ABNORMAL LOW (ref 1.005–1.030)
pH: 8 (ref 5.0–8.0)

## 2023-10-22 MED ORDER — HYDROMORPHONE HCL 1 MG/ML IJ SOLN
0.5000 mg | Freq: Once | INTRAMUSCULAR | Status: AC
  Administered 2023-10-22: 0.5 mg via INTRAVENOUS
  Filled 2023-10-22: qty 1
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
## 2023-10-22 NOTE — Discharge Instructions (Signed)
 Your ultrasound looked good.  We didn't see evidence of infection or loss of blood flow.  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  These appear stable since your last evaluation.  Please call the urologist for a follow-up exam.  Your CT scan revealed no kidney stones.  You had some inflammation in the lungs, but if you're not have cough or fever, nothing needs to be done for this.  If you develop cough or fever, please return to the ER or be seeing by your doctor.
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
## 2023-10-22 NOTE — ED Notes (Signed)
Lab called about add on. 

## 2023-10-24 LAB — GC/CHLAMYDIA PROBE AMP (~~LOC~~) NOT AT ARMC
Chlamydia: NEGATIVE
Comment: NEGATIVE
Comment: NORMAL
Neisseria Gonorrhea: NEGATIVE
